# Patient Record
Sex: Female | Born: 1935 | Race: White | Hispanic: No | Marital: Married | Smoking: Never smoker
Health system: Southern US, Community
[De-identification: ages and names within clinical notes are randomized; demographics above are authoritative.]

## PROBLEM LIST (undated history)

## (undated) DIAGNOSIS — I10 Essential (primary) hypertension: Secondary | ICD-10-CM

## (undated) DIAGNOSIS — E785 Hyperlipidemia, unspecified: Secondary | ICD-10-CM

## (undated) DIAGNOSIS — H409 Unspecified glaucoma: Secondary | ICD-10-CM

## (undated) HISTORY — PX: ABDOMINAL HYSTERECTOMY: SHX81

## (undated) HISTORY — DX: Essential (primary) hypertension: I10

## (undated) HISTORY — DX: Unspecified glaucoma: H40.9

## (undated) HISTORY — PX: EYE SURGERY: SHX253

## (undated) HISTORY — DX: Hyperlipidemia, unspecified: E78.5

---
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
## 2004-02-18 ENCOUNTER — Ambulatory Visit: Payer: Self-pay | Admitting: Internal Medicine

## 2005-01-13 ENCOUNTER — Ambulatory Visit: Payer: Self-pay | Admitting: Internal Medicine

## 2005-03-17 ENCOUNTER — Ambulatory Visit: Payer: Self-pay | Admitting: Internal Medicine

## 2006-04-08 ENCOUNTER — Ambulatory Visit: Payer: Self-pay | Admitting: Internal Medicine

## 2006-05-27 ENCOUNTER — Ambulatory Visit (HOSPITAL_COMMUNITY): Admission: RE | Admit: 2006-05-27 | Discharge: 2006-05-27 | Payer: Self-pay | Admitting: Ophthalmology

## 2007-04-12 ENCOUNTER — Ambulatory Visit: Payer: Self-pay | Admitting: Internal Medicine

## 2008-05-30 ENCOUNTER — Ambulatory Visit: Payer: Self-pay | Admitting: Internal Medicine

## 2009-06-03 ENCOUNTER — Ambulatory Visit: Payer: Self-pay | Admitting: Internal Medicine

## 2010-02-11 ENCOUNTER — Ambulatory Visit: Payer: Self-pay | Admitting: Urology

## 2010-04-09 ENCOUNTER — Ambulatory Visit: Payer: Self-pay | Admitting: Internal Medicine

## 2010-09-01 ENCOUNTER — Ambulatory Visit: Payer: Self-pay | Admitting: Internal Medicine

## 2011-10-05 DIAGNOSIS — E782 Mixed hyperlipidemia: Secondary | ICD-10-CM | POA: Insufficient documentation

## 2011-10-05 DIAGNOSIS — H409 Unspecified glaucoma: Secondary | ICD-10-CM | POA: Insufficient documentation

## 2011-10-05 DIAGNOSIS — E785 Hyperlipidemia, unspecified: Secondary | ICD-10-CM | POA: Insufficient documentation

## 2011-10-05 DIAGNOSIS — I10 Essential (primary) hypertension: Secondary | ICD-10-CM | POA: Insufficient documentation

## 2011-10-06 DIAGNOSIS — Z961 Presence of intraocular lens: Secondary | ICD-10-CM | POA: Insufficient documentation

## 2012-02-02 ENCOUNTER — Ambulatory Visit: Payer: Self-pay | Admitting: Internal Medicine

## 2013-01-10 DIAGNOSIS — H401490 Capsular glaucoma with pseudoexfoliation of lens, unspecified eye, stage unspecified: Secondary | ICD-10-CM | POA: Insufficient documentation

## 2013-03-20 ENCOUNTER — Ambulatory Visit: Payer: Self-pay | Admitting: Internal Medicine

## 2014-06-14 ENCOUNTER — Ambulatory Visit
Admission: RE | Admit: 2014-06-14 | Discharge: 2014-06-14 | Disposition: A | Discharge status: Home / Self Care | Payer: Medicare Other | Source: Ambulatory Visit | Attending: Gastroenterology | Admitting: Gastroenterology

## 2014-06-14 ENCOUNTER — Encounter
Admission: RE | Disposition: A | Discharge status: Home / Self Care | Payer: Self-pay | Source: Ambulatory Visit | Attending: Gastroenterology

## 2014-06-14 ENCOUNTER — Ambulatory Visit: Payer: Medicare Other | Admitting: Anesthesiology

## 2014-06-14 ENCOUNTER — Encounter: Payer: Self-pay | Admitting: Gastroenterology

## 2014-06-14 DIAGNOSIS — I10 Essential (primary) hypertension: Secondary | ICD-10-CM | POA: Insufficient documentation

## 2014-06-14 DIAGNOSIS — E785 Hyperlipidemia, unspecified: Secondary | ICD-10-CM | POA: Diagnosis not present

## 2014-06-14 DIAGNOSIS — R197 Diarrhea, unspecified: Secondary | ICD-10-CM | POA: Diagnosis not present

## 2014-06-14 DIAGNOSIS — Z79899 Other long term (current) drug therapy: Secondary | ICD-10-CM | POA: Diagnosis not present

## 2014-06-14 DIAGNOSIS — K625 Hemorrhage of anus and rectum: Secondary | ICD-10-CM | POA: Diagnosis present

## 2014-06-14 DIAGNOSIS — K5730 Diverticulosis of large intestine without perforation or abscess without bleeding: Secondary | ICD-10-CM | POA: Diagnosis not present

## 2014-06-14 DIAGNOSIS — R109 Unspecified abdominal pain: Secondary | ICD-10-CM | POA: Diagnosis not present

## 2014-06-14 HISTORY — PX: COLONOSCOPY: SHX5424

## 2014-06-14 SURGERY — COLONOSCOPY
Anesthesia: General

## 2014-06-14 MED ORDER — LIDOCAINE HCL (CARDIAC) 20 MG/ML IV SOLN
INTRAVENOUS | Status: DC | PRN
  Administered 2014-06-14: 40 mg via INTRAVENOUS

## 2014-06-14 MED ORDER — PROPOFOL 10 MG/ML IV BOLUS
INTRAVENOUS | Status: DC | PRN
  Administered 2014-06-14: 10 mg via INTRAVENOUS
  Administered 2014-06-14: 30 mg via INTRAVENOUS
  Administered 2014-06-14: 10 mg via INTRAVENOUS

## 2014-06-14 MED ORDER — SODIUM CHLORIDE 0.9 % IV SOLN
INTRAVENOUS | Status: DC
  Administered 2014-06-14: 09:00:00 via INTRAVENOUS

## 2014-06-14 MED ORDER — PROPOFOL INFUSION 10 MG/ML OPTIME
INTRAVENOUS | Status: DC | PRN
  Administered 2014-06-14: 120 ug/kg/min via INTRAVENOUS

## 2014-06-14 NOTE — Op Note (Signed)
[facility] Gastroenterology Patient Name: Kiwanna Mackowiak Procedure Date: 06/14/2014 8:59 AM MRN: [id number] Account #: [id number] Date of Birth: Apr 13, 1930 Admit Type: Outpatient Age: 81 [facility] 4 Gender: Female Note Status: Finalized Procedure:         Colonoscopy Indications:       Rectal bleeding, Bleeding has stopped Providers:         Ezzard StandingPaul Y. Bluford Kaufmannh, MD Referring MD:      Barbette ReichmannVishwanath Hande, MD (Referring MD) Medicines:         Monitored Anesthesia Care Complications:     No immediate complications. Procedure:         Pre-Anesthesia Assessment:                    - Prior to the procedure, a History and Physical was                     performed, and patient medications, allergies and                     sensitivities were reviewed. The patient's tolerance of                     previous anesthesia was reviewed.                    - The risks and benefits of the procedure and the sedation                     options and risks were discussed with the patient. All                     questions were answered and informed consent was obtained.                    - After reviewing the risks and benefits, the patient was                     deemed in satisfactory condition to undergo the procedure.                    After obtaining informed consent, the colonoscope was                     passed under direct vision. Throughout the procedure, the                     patient's blood pressure, pulse, and oxygen saturations                     were monitored continuously. The Olympus PCF-160AL                     Colonoscope (S# [id number]) was introduced through the anus                     and advanced to the the cecum, identified by appendiceal                     orifice and ileocecal valve. The colonoscopy was performed                     without difficulty. The patient tolerated the procedure  well. The quality of the bowel preparation was  good. Findings:      Multiple small and large-mouthed diverticula were found in the sigmoid       colon.      The exam was otherwise without abnormality. Impression:        - Diverticulosis in the sigmoid colon.                    - The examination was otherwise normal.                    - No specimens collected.                    - Pt likely had diverticular bleed. Recommendation:    - Discharge patient to home.                    - The findings and recommendations were discussed with the                     patient. Procedure Code(s): --- Professional ---                    [id number], Colonoscopy, flexible; diagnostic, including                     collection of specimen(s) by brushing or washing, when                     performed (separate procedure) Diagnosis Code(s): --- Professional ---                    K62.5, Hemorrhage of anus and rectum                    K57.30, Diverticulosis of large intestine without                     perforation or abscess without bleeding CPT copyright 2014 American Medical Association. All rights reserved. The codes documented in this report are preliminary and upon coder review may  be revised to meet current compliance requirements. Wallace CullensPaul Y Lillian Tigges, MD 06/14/2014 9:30:16 AM This report has been signed electronically. Number of Addenda: 0 Note Initiated On: 06/14/2014 8:59 AM Scope Withdrawal Time: 0 hours 3 minutes 15 seconds  Total Procedure Duration: 0 hours 7 minutes 24 seconds       [facility]

## 2014-06-14 NOTE — H&P (Signed)
  Date of Initial H&P:05/29/2014  History reviewed, patient examined, no change in status, stable for surgery. 

## 2014-06-14 NOTE — Anesthesia Preprocedure Evaluation (Signed)
Anesthesia Evaluation  Patient identified by MRN, date of birth, ID band Patient awake    Reviewed: Allergy & Precautions, NPO status , Patient's Chart, lab work & pertinent test results  History of Anesthesia Complications Negative for: history of anesthetic complications  Airway Mallampati: II       Dental no notable dental hx. (+) Chipped   Pulmonary neg pulmonary ROS,    Pulmonary exam normal       Cardiovascular Exercise Tolerance: Good hypertension, Pt. on medications Normal cardiovascular examRhythm:Regular Rate:Normal     Neuro/Psych negative neurological ROS  negative psych ROS   GI/Hepatic negative GI ROS, Neg liver ROS,   Endo/Other  negative endocrine ROS  Renal/GU   negative genitourinary   Musculoskeletal negative musculoskeletal ROS (+)   Abdominal   Peds negative pediatric ROS (+)  Hematology negative hematology ROS (+)   Anesthesia Other Findings   Reproductive/Obstetrics negative OB ROS                             Anesthesia Physical Anesthesia Plan  ASA: II  Anesthesia Plan: General   Post-op Pain Management:    Induction: Intravenous  Airway Management Planned: Nasal Cannula  Additional Equipment:   Intra-op Plan:   Post-operative Plan:   Informed Consent: I have reviewed the patients History and Physical, chart, labs and discussed the procedure including the risks, benefits and alternatives for the proposed anesthesia with the patient or authorized representative who has indicated his/her understanding and acceptance.     Plan Discussed with: CRNA and Surgeon  Anesthesia Plan Comments:         Anesthesia Quick Evaluation

## 2014-06-14 NOTE — Anesthesia Postprocedure Evaluation (Signed)
  Anesthesia Post-op Note  Patient: Stacey Lee  Procedure(s) Performed: Procedure(s): COLONOSCOPY (N/A)  Anesthesia type:General  Patient location: PACU  Post pain: Pain level controlled  Post assessment: Post-op Vital signs reviewed, Patient's Cardiovascular Status Stable, Respiratory Function Stable, Patent Airway and No signs of Nausea or vomiting  Post vital signs: Reviewed and stable  Last Vitals:  Filed Vitals:   06/14/14 0933  BP:   Pulse:   Temp: 35.9 C  Resp:     Level of consciousness: awake, alert  and patient cooperative  Complications: No apparent anesthesia complications

## 2014-06-14 NOTE — Transfer of Care (Signed)
Immediate Anesthesia Transfer of Care Note  Patient: Stacey Lee  Procedure(s) Performed: Procedure(s): COLONOSCOPY (N/A)  Patient Location: Endoscopy Unit  Anesthesia Type:General  Level of Consciousness: sedated  Airway & Oxygen Therapy: Patient Spontanous Breathing and Patient connected to nasal cannula oxygen  Post-op Assessment: Report given to RN and Post -op Vital signs reviewed and stable  Post vital signs: Reviewed and stable  Last Vitals:  Filed Vitals:   06/14/14 0932  BP: 112/45  Pulse: 73  Temp: 35.8 C  Resp: 20    Complications: No apparent anesthesia complications

## 2014-06-14 NOTE — Anesthesia Procedure Notes (Signed)
Performed by: Hafsah Hendler Pre-anesthesia Checklist: Patient identified, Suction available, Emergency Drugs available, Timeout performed and Patient being monitored Patient Re-evaluated:Patient Re-evaluated prior to inductionOxygen Delivery Method: Nasal cannula Intubation Type: IV induction       

## 2014-06-14 NOTE — Anesthesia Postprocedure Evaluation (Signed)
  Anesthesia Post-op Note  Patient: Stacey Lee  Procedure(s) Performed: Procedure(s): COLONOSCOPY (N/A)  Anesthesia type:General  Patient location: PACU  Post pain: Pain level controlled  Post assessment: Post-op Vital signs reviewed, Patient's Cardiovascular Status Stable, Respiratory Function Stable, Patent Airway and No signs of Nausea or vomiting  Post vital signs: Reviewed and stable  Last Vitals:  Filed Vitals:   06/14/14 0831  BP: 171/77  Pulse: 77  Temp: 36.8 C  Resp: 17    Level of consciousness: awake, alert  and patient cooperative  Complications: No apparent anesthesia complications

## 2014-06-19 ENCOUNTER — Encounter: Payer: Self-pay | Admitting: Gastroenterology

## 2014-06-26 ENCOUNTER — Other Ambulatory Visit: Payer: Self-pay | Admitting: Internal Medicine

## 2014-06-26 DIAGNOSIS — Z1231 Encounter for screening mammogram for malignant neoplasm of breast: Secondary | ICD-10-CM

## 2014-07-04 ENCOUNTER — Ambulatory Visit
Admission: RE | Admit: 2014-07-04 | Discharge: 2014-07-04 | Disposition: A | Discharge status: Home / Self Care | Payer: Medicare Other | Source: Ambulatory Visit | Attending: Internal Medicine | Admitting: Internal Medicine

## 2014-07-04 ENCOUNTER — Other Ambulatory Visit: Payer: Self-pay | Admitting: Internal Medicine

## 2014-07-04 DIAGNOSIS — Z1231 Encounter for screening mammogram for malignant neoplasm of breast: Secondary | ICD-10-CM | POA: Diagnosis present

## 2015-05-27 ENCOUNTER — Other Ambulatory Visit: Payer: Self-pay | Admitting: Internal Medicine

## 2015-05-27 DIAGNOSIS — Z1231 Encounter for screening mammogram for malignant neoplasm of breast: Secondary | ICD-10-CM

## 2015-07-05 ENCOUNTER — Ambulatory Visit: Payer: Medicare Other

## 2015-07-18 ENCOUNTER — Ambulatory Visit: Payer: Medicare Other | Attending: Internal Medicine

## 2015-08-20 ENCOUNTER — Other Ambulatory Visit: Payer: Self-pay | Admitting: Internal Medicine

## 2015-08-20 ENCOUNTER — Ambulatory Visit
Admission: RE | Admit: 2015-08-20 | Discharge: 2015-08-20 | Disposition: A | Discharge status: Home / Self Care | Payer: Medicare Other | Source: Ambulatory Visit | Attending: Internal Medicine | Admitting: Internal Medicine

## 2015-08-20 DIAGNOSIS — Z1231 Encounter for screening mammogram for malignant neoplasm of breast: Secondary | ICD-10-CM

## 2016-07-20 ENCOUNTER — Other Ambulatory Visit: Payer: Self-pay | Admitting: Internal Medicine

## 2016-07-20 DIAGNOSIS — Z1231 Encounter for screening mammogram for malignant neoplasm of breast: Secondary | ICD-10-CM

## 2016-08-21 ENCOUNTER — Ambulatory Visit
Admission: RE | Admit: 2016-08-21 | Discharge: 2016-08-21 | Disposition: A | Discharge status: Home / Self Care | Payer: Medicare Other | Source: Ambulatory Visit | Attending: Internal Medicine | Admitting: Internal Medicine

## 2016-08-21 DIAGNOSIS — Z1231 Encounter for screening mammogram for malignant neoplasm of breast: Secondary | ICD-10-CM | POA: Insufficient documentation

## 2017-07-19 ENCOUNTER — Other Ambulatory Visit: Payer: Self-pay | Admitting: Internal Medicine

## 2017-07-19 DIAGNOSIS — Z1231 Encounter for screening mammogram for malignant neoplasm of breast: Secondary | ICD-10-CM

## 2017-08-06 ENCOUNTER — Other Ambulatory Visit: Payer: Self-pay | Admitting: Internal Medicine

## 2017-08-06 DIAGNOSIS — R2681 Unsteadiness on feet: Secondary | ICD-10-CM

## 2017-08-06 DIAGNOSIS — R42 Dizziness and giddiness: Secondary | ICD-10-CM

## 2017-08-06 DIAGNOSIS — I10 Essential (primary) hypertension: Secondary | ICD-10-CM

## 2017-08-06 DIAGNOSIS — E7800 Pure hypercholesterolemia, unspecified: Secondary | ICD-10-CM

## 2017-08-21 ENCOUNTER — Ambulatory Visit
Admission: RE | Admit: 2017-08-21 | Discharge: 2017-08-21 | Disposition: A | Discharge status: Home / Self Care | Payer: Medicare HMO | Source: Ambulatory Visit | Attending: Internal Medicine | Admitting: Internal Medicine

## 2017-08-21 DIAGNOSIS — I10 Essential (primary) hypertension: Secondary | ICD-10-CM

## 2017-08-21 DIAGNOSIS — R2681 Unsteadiness on feet: Secondary | ICD-10-CM | POA: Insufficient documentation

## 2017-08-21 DIAGNOSIS — R42 Dizziness and giddiness: Secondary | ICD-10-CM | POA: Insufficient documentation

## 2017-08-21 DIAGNOSIS — E7800 Pure hypercholesterolemia, unspecified: Secondary | ICD-10-CM | POA: Insufficient documentation

## 2017-08-21 DIAGNOSIS — I739 Peripheral vascular disease, unspecified: Secondary | ICD-10-CM | POA: Insufficient documentation

## 2017-08-23 ENCOUNTER — Ambulatory Visit
Admission: RE | Admit: 2017-08-23 | Discharge: 2017-08-23 | Disposition: A | Discharge status: Home / Self Care | Payer: Medicare HMO | Source: Ambulatory Visit | Attending: Internal Medicine | Admitting: Internal Medicine

## 2017-08-23 DIAGNOSIS — Z1231 Encounter for screening mammogram for malignant neoplasm of breast: Secondary | ICD-10-CM | POA: Diagnosis not present

## 2017-08-31 NOTE — Progress Notes (Signed)
09/01/2017 8:43 AM   Nakaya M Kamau 04/30/1930 [id number]  Referring provider: Barbette Reichmann, MD [address] [facility] [place], [place] [address]  Chief Complaint  Patient presents with  . Hematuria    HPI: Patient is a 84 -year-old Caucasian female who presents today as a referral from Dr. Marcello Fennel for microscopic hematuria.    Patient was found to have microscopic hematuria on 07/28/2017 with 4-10 WBC's and 10-50 RBC's/hpf.  Urine culture is negative.  Patient does have a prior history of microscopic hematuria.  She underwent a work up in 2003 with IVP and cystoscopy.  No significant findings.  She underwent a work up in 2012 with CTU and cystoscopy.  No significant findings.    She does have a prior history of recurrent urinary tract infections.  She does not have a history of nephrolithiasis, trauma to the genitourinary tract or malignancies of the genitourinary tract.   She does not have a family medical history of nephrolithiasis, malignancies of the genitourinary tract or hematuria.   Today, she is having symptoms of strong urgency, nocturia x 1 and stress incontinence for the last two years.  Patient denies any gross hematuria, dysuria or suprapubic/flank pain.  Patient denies any fevers, chills, nausea or vomiting. Her UA today demonstrates 11-30 RBC's.    She is not a smoker.  She is not exposed to secondhand smoke.  She has not worked with Personnel officer, trichloroethylene, etc.   She has HTN.    PMH: Past Medical History:  Diagnosis Date  . Glaucoma   . Hyperlipidemia   . Hypertension     Surgical History: Past Surgical History:  Procedure Laterality Date  . ABDOMINAL HYSTERECTOMY    . BREAST BIOPSY Right 1954   negative  . COLONOSCOPY N/A 06/14/2014   Procedure: COLONOSCOPY;  Surgeon: Wallace Cullens, MD;  Location: [facility] ENDOSCOPY;  Service: Gastroenterology;  Laterality: N/A;  . EYE SURGERY      Home Medications:  Allergies as of  09/01/2017      Reactions   Acetanilide Shortness Of Breath, Nausea Only   Acetazolamide Nausea Only, Other (See Comments)   Other Reaction: SOB, dizzy (po) Other Reaction: SOB, dizzy (po)   Penicillins Swelling      Medication List        Accurate as of 09/01/17  8:43 AM. Always use your most recent med list.          amLODipine 2.5 MG tablet Commonly known as:  NORVASC TAKE 1 TABLET BY MOUTH ONCE DAILY   CALCIUM 600 + D PO Take 1 tablet by mouth 2 (two) times daily.   Co Q 10 100 MG Caps Take 1 tablet by mouth daily.   Fish Oil 1000 MG Caps Take 1 capsule by mouth 2 (two) times daily.   ibuprofen 200 MG tablet Commonly known as:  ADVIL,MOTRIN Take 400 mg by mouth every 6 (six) hours as needed for headache or moderate pain.   lisinopril 30 MG tablet Commonly known as:  PRINIVIL,ZESTRIL Take 30 mg by mouth daily.   lovastatin 20 MG tablet Commonly known as:  MEVACOR Take 20 mg by mouth 3 (three) times a week. Monday, Wednesday, and Friday   multivitamin with minerals Tabs tablet Take 1 tablet by mouth daily.   prednisoLONE acetate 1 % ophthalmic suspension Commonly known as:  PRED FORTE Place 1 drop into the left eye daily.   Red Yeast Rice 600 MG Caps Take 1 capsule by  mouth 2 (two) times daily.   RESTASIS 0.05 % ophthalmic emulsion Generic drug:  cycloSPORINE INSTILL 1 DROP INTO EACH EYE TWICE DAILY       Allergies:  Allergies  Allergen Reactions  . Acetanilide Shortness Of Breath and Nausea Only  . Acetazolamide Nausea Only and Other (See Comments)    Other Reaction: SOB, dizzy (po) Other Reaction: SOB, dizzy (po)   . Penicillins Swelling    Family History: Family History  Problem Relation Age of Onset  . Prostate cancer Father   . Tuberculosis Maternal Grandfather   . Breast cancer Neg Hx   . Kidney cancer Neg Hx   . Bladder Cancer Neg Hx     Social History:  reports that she has never smoked. She has never used smokeless tobacco. She  reports that she drank alcohol. She reports that she does not use drugs.  ROS: UROLOGY Frequent Urination?: No Hard to postpone urination?: Yes Burning/pain with urination?: No Get up at night to urinate?: Yes Leakage of urine?: Yes Urine stream starts and stops?: No Trouble starting stream?: No Do you have to strain to urinate?: No Blood in urine?: No Urinary tract infection?: No Sexually transmitted disease?: No Injury to kidneys or bladder?: No Painful intercourse?: No Weak stream?: No Currently pregnant?: No Vaginal bleeding?: No Last menstrual period?: n  Gastrointestinal Nausea?: No Vomiting?: No Indigestion/heartburn?: Yes Diarrhea?: No Constipation?: No  Constitutional Fever: No Night sweats?: No Weight loss?: No Fatigue?: No  Skin Skin rash/lesions?: No Itching?: No  Eyes Blurred vision?: No Double vision?: No  Ears/Nose/Throat Sore throat?: No Sinus problems?: Yes  Hematologic/Lymphatic Swollen glands?: No Easy bruising?: No  Cardiovascular Leg swelling?: No Chest pain?: No  Respiratory Cough?: No Shortness of breath?: No  Endocrine Excessive thirst?: No  Musculoskeletal Back pain?: Yes Joint pain?: No  Neurological Headaches?: No Dizziness?: Yes  Psychologic Depression?: No Anxiety?: No  Physical Exam: BP (!) 178/82 (BP Location: Left Arm, Patient Position: Sitting, Cuff Size: Normal)   Pulse 79   Ht 5' (1.524 m)   Wt 123 lb 9.6 oz (56.1 kg)   BMI 24.14 kg/m   Constitutional:  Well nourished. Alert and oriented, No acute distress. HEENT: [place] AT, moist mucus membranes.  Trachea midline, no masses. Cardiovascular: No clubbing, cyanosis, or edema. Respiratory: Normal respiratory effort, no increased work of breathing. GI: Abdomen is soft, non tender, non distended, no abdominal masses. Liver and spleen not palpable.  No hernias appreciated.  Stool sample for occult testing is not indicated.   GU: No CVA tenderness.  No  bladder fullness or masses.   Skin: No rashes, bruises or suspicious lesions. Lymph: No cervical or inguinal adenopathy. Neurologic: Grossly intact, no focal deficits, moving all 4 extremities. Psychiatric: Normal mood and affect.  Laboratory Data: No results found for: WBC, HGB, HCT, MCV, PLT  No results found for: CREATININE  No results found for: PSA  No results found for: TESTOSTERONE  No results found for: HGBA1C  No results found for: TSH  No results found for: CHOL, HDL, CHOLHDL, VLDL, LDLCALC  No results found for: AST No results found for: ALT No components found for: ALKALINEPHOPHATASE No components found for: BILIRUBINTOTAL  No results found for: ESTRADIOL   Urinalysis 11-30 RBC's.  See HPI and Epic.   I have reviewed the labs.  Assessment & Plan:    1. Microscopic hematuria Explained to the patient that there are a number of causes that can be associated with blood in the  urine, such as stones, UTI's, damage to the urinary tract and/or cancer. At this time, I felt that the patient warranted further urologic evaluation.   The AUA guidelines state that a CT urogram is the preferred imaging study to evaluate hematuria. I explained to the patient that a contrast material will be injected into a vein and that in rare instances, an allergic reaction can result and may even life threatening   The patient denies any allergies to contrast, iodine and/or seafood and is not taking metformin. Following the imaging study,  I've recommended a cystoscopy. I described how this is performed, typically in an office setting with a flexible cystoscope. We described the risks, benefits, and possible side effects, the most common of which is a minor amount of blood in the urine and/or burning which usually resolves in 24 to 48 hours.   The patient had the opportunity to ask questions which were answered. Based upon this discussion, the patient is willing to proceed. Therefore, I've  ordered: a CT Urogram and cystoscopy.  The patient will return following all of the above for discussion of the results.  UA 11-30 RBC's  Urine culture pending BUN + creatinine pending   Return for CT Urogram report and cystoscopy; patient is a former patient of Dr. Stoioff .  These notes generated with voice recognition software. I apologize for typographical errors.  Michiel CowbLonna CobboySHANNON Tamirah George, PA-C  [facility] [address]  [place], [place] [address] [phone]

## 2017-09-01 ENCOUNTER — Ambulatory Visit: Payer: Medicare HMO | Admitting: Urology

## 2017-09-01 ENCOUNTER — Encounter: Payer: Self-pay | Admitting: Urology

## 2017-09-01 VITALS — BP 178/82 | HR 79 | Ht 60.0 in | Wt 123.6 lb

## 2017-09-01 DIAGNOSIS — R3129 Other microscopic hematuria: Secondary | ICD-10-CM

## 2017-09-01 LAB — URINALYSIS, COMPLETE
BILIRUBIN UA: NEGATIVE
Glucose, UA: NEGATIVE
Ketones, UA: NEGATIVE
Nitrite, UA: NEGATIVE
PH UA: 6 (ref 5.0–7.5)
PROTEIN UA: NEGATIVE
Specific Gravity, UA: 1.02 (ref 1.005–1.030)
Urobilinogen, Ur: 0.2 mg/dL (ref 0.2–1.0)

## 2017-09-01 LAB — MICROSCOPIC EXAMINATION

## 2017-09-01 NOTE — Patient Instructions (Signed)
Hematuria, Adult Hematuria is blood in your urine. It can be caused by a bladder infection, kidney infection, prostate infection, kidney stone, or cancer of your urinary tract. Infections can usually be treated with medicine, and a kidney stone usually will pass through your urine. If neither of these is the cause of your hematuria, further workup to find out the reason may be needed. It is very important that you tell your health care provider about any blood you see in your urine, even if the blood stops without treatment or happens without causing pain. Blood in your urine that happens and then stops and then happens again can be a symptom of a very serious condition. Also, pain is not a symptom in the initial stages of many urinary cancers. Follow these instructions at home:  Drink lots of fluid, 3-4 quarts a day. If you have been diagnosed with an infection, cranberry juice is especially recommended, in addition to large amounts of water.  Avoid caffeine, tea, and carbonated beverages because they tend to irritate the bladder.  Avoid alcohol because it may irritate the prostate.  Take all medicines as directed by your health care provider.  If you were prescribed an antibiotic medicine, finish it all even if you start to feel better.  If you have been diagnosed with a kidney stone, follow your health care provider's instructions regarding straining your urine to catch the stone.  Empty your bladder often. Avoid holding urine for long periods of time.  After a bowel movement, women should cleanse front to back. Use each tissue only once.  Empty your bladder before and after sexual intercourse if you are a female. Contact a health care provider if:  You develop back pain.  You have a fever.  You have a feeling of sickness in your stomach (nausea) or vomiting.  Your symptoms are not better in 3 days. Return sooner if you are getting worse. Get help right away if:  You develop  severe vomiting and are unable to keep the medicine down.  You develop severe back or abdominal pain despite taking your medicines.  You begin passing a large amount of blood or clots in your urine.  You feel extremely weak or faint, or you pass out. This information is not intended to replace advice given to you by your health care provider. Make sure you discuss any questions you have with your health care provider. Document Released: 01/12/2005 Document Revised: 06/20/2015 Document Reviewed: 09/12/2012 Elsevier Interactive Patient Education  2017 Elsevier Inc.  CT Scan A CT scan (computed tomography scan) is an imaging scan. It uses X-rays and a computer to make detailed pictures of different areas inside the body. A CT scan can give more information than a regular X-ray exam. A CT scan provides data about internal organs, soft tissue structures, blood vessels, and bones. In this procedure, the pictures will be taken in a large machine that has an opening (CT scanner). Tell a health care provider about:  Any allergies you have.  All medicines you are taking, including vitamins, herbs, eye drops, creams, and over-the-counter medicines.  Any blood disorders you have.  Any surgeries you have had.  Any medical conditions you have.  Whether you are pregnant or may be pregnant. What are the risks? Generally, this is a safe procedure. However, problems may occur, including:  An allergic reaction to dyes.  Development of cancer from excessive exposure to radiation from multiple CT scans. This is rare.  What happens before   the procedure? Staying hydrated Follow instructions from your health care provider about hydration, which may include:  Up to 2 hours before the procedure - you may continue to drink clear liquids, such as water, clear fruit juice, black coffee, and plain tea.  Eating and drinking restrictions Follow instructions from your health care provider about eating and  drinking, which may include:  24 hours before the procedure - stop drinking caffeinated beverages, such as energy drinks, tea, soda, coffee, and hot chocolate.  8 hours before the procedure - stop eating heavy meals or foods such as meat, fried foods, or fatty foods.  6 hours before the procedure - stop eating light meals or foods, such as toast or cereal.  6 hours before the procedure - stop drinking milk or drinks that contain milk.  2 hours before the procedure - stop drinking clear liquids.  General instructions  Remove any jewelry.  Ask your health care provider about changing or stopping your regular medicines. This is especially important if you are taking diabetes medicines or blood thinners. What happens during the procedure?  You will lie on a table with your arms above your head.  An IV tube may be inserted into one of your veins.  The contrast dye may be injected into the IV tube. You may feel warm or have a metallic taste in your mouth.  The table you will be lying on will move into the CT scanner.  You will be able to see, hear, and talk to the person running the machine while you are in it. Follow that person's instructions.  The CT scanner will move around you to take pictures. Do not move while it is scanning. Staying still helps the scanner to get a good image.  When the best possible pictures have been taken, the machine will be turned off. The table will be moved out of the machine.  The IV tube will be removed. The procedure may vary among health care providers and hospitals. What happens after the procedure?  It is up to you to get the results of your procedure. Ask your health care provider, or the department that is doing the procedure, when your results will be ready. Summary  A CT scan is an imaging scan.  A CT scan uses X-rays and a computer to make detailed pictures of different areas of your body.  Follow instructions from your health care  provider about eating and drinking before the procedure.  You will be able to see, hear, and talk to the person running the machine while you are in it. Follow that person's instructions. This information is not intended to replace advice given to you by your health care provider. Make sure you discuss any questions you have with your health care provider. Document Released: 02/20/2004 Document Revised: 02/15/2016 Document Reviewed: 02/15/2016 Elsevier Interactive Patient Education  2018 Elsevier Inc.  Cystoscopy Cystoscopy is a procedure that is used to help diagnose and sometimes treat conditions that affect that lower urinary tract. The lower urinary tract includes the bladder and the tube that drains urine from the bladder out of the body (urethra). Cystoscopy is performed with a thin, tube-shaped instrument with a light and camera at the end (cystoscope). The cystoscope may be hard (rigid) or flexible, depending on the goal of the procedure.The cystoscope is inserted through the urethra, into the bladder. Cystoscopy may be recommended if you have:  Urinary tractinfections that keep coming back (recurring).  Blood in the urine (hematuria).    Loss of bladder control (urinary incontinence) or an overactive bladder.  Unusual cells found in a urine sample.  A blockage in the urethra.  Painful urination.  An abnormality in the bladder found during an intravenous pyelogram (IVP) or CT scan.  Cystoscopy may also be done to remove a sample of tissue to be examined under a microscope (biopsy). Tell a health care provider about:  Any allergies you have.  All medicines you are taking, including vitamins, herbs, eye drops, creams, and over-the-counter medicines.  Any problems you or family members have had with anesthetic medicines.  Any blood disorders you have.  Any surgeries you have had.  Any medical conditions you have.  Whether you are pregnant or may be pregnant. What are the  risks? Generally, this is a safe procedure. However, problems may occur, including:  Infection.  Bleeding.  Allergic reactions to medicines.  Damage to other structures or organs.  What happens before the procedure?  Ask your health care provider about: ? Changing or stopping your regular medicines. This is especially important if you are taking diabetes medicines or blood thinners. ? Taking medicines such as aspirin and ibuprofen. These medicines can thin your blood. Do not take these medicines before your procedure if your health care provider instructs you not to.  Follow instructions from your health care provider about eating or drinking restrictions.  You may be given antibiotic medicine to help prevent infection.  You may have an exam or testing, such as X-rays of the bladder, urethra, or kidneys.  You may have urine tests to check for signs of infection.  Plan to have someone take you home after the procedure. What happens during the procedure?  To reduce your risk of infection,your health care team will wash or sanitize their hands.  You will be given one or more of the following: ? A medicine to help you relax (sedative). ? A medicine to numb the area (local anesthetic).  The area around the opening of your urethra will be cleaned.  The cystoscope will be passed through your urethra into your bladder.  Germ-free (sterile)fluid will flow through the cystoscope to fill your bladder. The fluid will stretch your bladder so that your surgeon can clearly examine your bladder walls.  The cystoscope will be removed and your bladder will be emptied. The procedure may vary among health care providers and hospitals. What happens after the procedure?  You may have some soreness or pain in your abdomen and urethra. Medicines will be available to help you.  You may have some blood in your urine.  Do not drive for 24 hours if you received a sedative. This information is  not intended to replace advice given to you by your health care provider. Make sure you discuss any questions you have with your health care provider. Document Released: 01/10/2000 Document Revised: 05/23/2015 Document Reviewed: 11/29/2014 Elsevier Interactive Patient Education  2018 Elsevier Inc.  

## 2017-09-02 LAB — BUN+CREAT
BUN/Creatinine Ratio: 19 (ref 12–28)
BUN: 15 mg/dL (ref 8–27)
CREATININE: 0.79 mg/dL (ref 0.57–1.00)
GFR, EST AFRICAN AMERICAN: 79 mL/min/{1.73_m2} (ref >59)
GFR, EST NON AFRICAN AMERICAN: 68 mL/min/{1.73_m2} (ref >59)

## 2017-09-04 LAB — CULTURE, URINE COMPREHENSIVE

## 2017-09-17 ENCOUNTER — Ambulatory Visit
Admission: RE | Admit: 2017-09-17 | Discharge: 2017-09-17 | Disposition: A | Discharge status: Home / Self Care | Payer: Medicare HMO | Source: Ambulatory Visit | Attending: Urology | Admitting: Urology

## 2017-09-17 DIAGNOSIS — K449 Diaphragmatic hernia without obstruction or gangrene: Secondary | ICD-10-CM | POA: Diagnosis not present

## 2017-09-17 DIAGNOSIS — I700 Atherosclerosis of aorta: Secondary | ICD-10-CM | POA: Diagnosis not present

## 2017-09-17 DIAGNOSIS — R3129 Other microscopic hematuria: Secondary | ICD-10-CM | POA: Diagnosis present

## 2017-09-17 MED ORDER — IOPAMIDOL (ISOVUE-300) INJECTION 61%
100.0000 mL | Freq: Once | INTRAVENOUS | Status: AC | PRN
  Administered 2017-09-17: 100 mL via INTRAVENOUS

## 2017-10-06 ENCOUNTER — Ambulatory Visit: Payer: Medicare HMO | Admitting: Urology

## 2017-10-06 DIAGNOSIS — R3129 Other microscopic hematuria: Secondary | ICD-10-CM | POA: Diagnosis not present

## 2017-10-06 LAB — URINALYSIS, COMPLETE
Bilirubin, UA: NEGATIVE
Glucose, UA: NEGATIVE
Ketones, UA: NEGATIVE
Nitrite, UA: NEGATIVE
PH UA: 6 (ref 5.0–7.5)
PROTEIN UA: NEGATIVE
Specific Gravity, UA: 1.015 (ref 1.005–1.030)
Urobilinogen, Ur: 0.2 mg/dL (ref 0.2–1.0)

## 2017-10-06 LAB — MICROSCOPIC EXAMINATION

## 2017-10-06 MED ORDER — LIDOCAINE HCL URETHRAL/MUCOSAL 2 % EX GEL
1.0000 "application " | Freq: Once | CUTANEOUS | Status: AC
  Administered 2017-10-06: 1 via URETHRAL

## 2017-10-06 NOTE — Addendum Note (Signed)
Addended by: Frankey Shown on: 10/06/2017 03:05 PM   Modules accepted: Orders

## 2017-10-06 NOTE — Progress Notes (Signed)
   10/06/17  Indications: Microhematuria  HPI: See Carollee Herter McGowan's note dated 09/01/2017.  CTU showed no upper tract abnormalities.  There were no vitals taken for this visit. NED. A&Ox3.   No respiratory distress   Abd soft, NT, ND Atrophic external genitalia with patent urethral meatus.  Small urethral caruncle  Cystoscopy Procedure Note  Patient identification was confirmed, informed consent was obtained, and patient was prepped using Betadine solution.  Lidocaine jelly was administered per urethral meatus.    Preoperative abx where received prior to procedure.    Procedure: - Flexible cystoscope introduced, without any difficulty.   - Thorough search of the bladder revealed:    normal urethral meatus    normal urothelium    no stones    no ulcers     no tumors    no urethral polyps    no trabeculation  - Ureteral orifices were normal in position and appearance.  Post-Procedure: - Patient tolerated the procedure well  Assessment/ Plan: No significant abnormalities on CTU/cystoscopy.  Urinalysis today with significantly decreased microhematuria.  Follow-up 6 months with Carollee Herter for recheck/repeat urinalysis.   Riki Altes, MD

## 2018-04-05 NOTE — Progress Notes (Incomplete)
04/06/2018 8:41 AM   Stacey Lee 11-Dec-1930 [id number]  Referring provider: Barbette Reichmann, MD [address] [facility] [place], [place] [address]  No chief complaint on file.   HPI: Jeanifer KEEONA LIEBELT is a 85 y.o. female Caucasian with microscopic hematuria who presents today for a six month follow up.  Patient was found to have microscopic hematuria on 07/28/2017 with 4-10 WBC's and 10-50 RBC's/hpf.  Urine culture is negative.  Patient does have a prior history of microscopic hematuria.  She underwent a work up in 2003 with IVP and cystoscopy.  No significant findings.  She underwent a work up in 2012 with CTU and cystoscopy.  No significant findings.    She does have a prior history of recurrent urinary tract infections.  She does not have a history of nephrolithiasis, trauma to the genitourinary tract or malignancies of the genitourinary tract.   She does not have a family medical history of nephrolithiasis, malignancies of the genitourinary tract or hematuria.   On 09/01/2017, she had been having symptoms of strong urgency, nocturia x 1 and stress incontinence for the last two years.  Patient denied any gross hematuria, dysuria or suprapubic/flank pain.  Patient denied any fevers, chills, nausea or vomiting.  Her UA demonstrated 11-30 RBC's.  She is not a smoker.  She is not exposed to secondhand smoke.  She has not worked with Personnel officer, trichloroethylene, etc.  She has *** HTN.  Patient had a CTU with Dr. Reche Dixon on 09/17/2017, with the impression of: 1.  No acute process or explanation for hematuria.  2. Air within the urinary bladder is presumably iatrogenic.  Correlate with instrumentation.  3. Small hiatal hernia.  4. Aortic Atherosclerosis (ICD10-I70.0).  Cystoscopy on 10/06/2017 by Dr. Lonna Cobb found no significant abnormalities.  UA on that visit found significantly decreased microhematuria.  ***  PMH: Past Medical History:  Diagnosis Date    Glaucoma    Hyperlipidemia    Hypertension     Surgical History: Past Surgical History:  Procedure Laterality Date   ABDOMINAL HYSTERECTOMY     BREAST BIOPSY Right 1954   negative   COLONOSCOPY N/A 06/14/2014   Procedure: COLONOSCOPY;  Surgeon: Wallace Cullens, MD;  Location: [facility] ENDOSCOPY;  Service: Gastroenterology;  Laterality: N/A;   EYE SURGERY      Home Medications:  Allergies as of 04/06/2018      Reactions   Acetanilide Shortness Of Breath, Nausea Only   Acetazolamide Nausea Only, Other (See Comments)   Other Reaction: SOB, dizzy (po) Other Reaction: SOB, dizzy (po)   Penicillins Swelling      Medication List       Accurate as of April 05, 2018  8:41 AM. Always use your most recent med list.        amLODipine 2.5 MG tablet Commonly known as:  NORVASC TAKE 1 TABLET BY MOUTH ONCE DAILY   CALCIUM 600 + D PO Take 1 tablet by mouth 2 (two) times daily.   Co Q 10 100 MG Caps Take 1 tablet by mouth daily.   Fish Oil 1000 MG Caps Take 1 capsule by mouth 2 (two) times daily.   ibuprofen 200 MG tablet Commonly known as:  ADVIL,MOTRIN Take 400 mg by mouth every 6 (six) hours as needed for headache or moderate pain.   lisinopril 30 MG tablet Commonly known as:  PRINIVIL,ZESTRIL Take 30 mg by mouth daily.   lovastatin 20 MG tablet Commonly known as:  MEVACOR  Take 20 mg by mouth 3 (three) times a week. Monday, Wednesday, and Friday   multivitamin with minerals Tabs tablet Take 1 tablet by mouth daily.   prednisoLONE acetate 1 % ophthalmic suspension Commonly known as:  PRED FORTE Place 1 drop into the left eye daily.   Red Yeast Rice 600 MG Caps Take 1 capsule by mouth 2 (two) times daily.   Restasis 0.05 % ophthalmic emulsion Generic drug:  cycloSPORINE INSTILL 1 DROP INTO EACH EYE TWICE DAILY       Allergies:  Allergies  Allergen Reactions   Acetanilide Shortness Of Breath and Nausea Only   Acetazolamide Nausea Only and Other (See Comments)      Other Reaction: SOB, dizzy (po) Other Reaction: SOB, dizzy (po)    Penicillins Swelling    Family History: Family History  Problem Relation Age of Onset   Prostate cancer Father    Tuberculosis Maternal Grandfather    Breast cancer Neg Hx    Kidney cancer Neg Hx    Bladder Cancer Neg Hx     Social History:  reports that she has never smoked. She has never used smokeless tobacco. She reports previous alcohol use. She reports that she does not use drugs.  ROS:                                        Physical Exam: There were no vitals taken for this visit.  Constitutional: Well nourished. Alert and oriented, No acute distress. {HEENT: [place] AT, moist mucus membranes.  Trachea midline, no masses.} Cardiovascular: No clubbing, cyanosis, or edema. Respiratory: Normal respiratory effort, no increased work of breathing. {GI: Abdomen is soft, non tender, non distended, no abdominal masses. Liver and spleen not palpable.  No hernias appreciated.  Stool sample for occult testing is not indicated.} {GU: No CVA tenderness.  No bladder fullness or masses.  *** external genitalia, *** pubic hair distribution, no lesions.  Normal urethral meatus, no lesions, no prolapse, no discharge.   No urethral masses, tenderness and/or tenderness. No bladder fullness, tenderness or masses. *** vagina mucosa, *** estrogen effect, no discharge, no lesions, *** pelvic support, *** cystocele and *** rectocele noted.  No cervical motion tenderness.  Uterus is freely mobile and non-fixed.  No adnexal/parametria masses or tenderness noted.  Anus and perineum are without rashes or lesions.   *** } Skin: No rashes, bruises or suspicious lesions. {Lymph: No cervical or inguinal adenopathy.} Neurologic: Grossly intact, no focal deficits, moving all 4 extremities. Psychiatric: Normal mood and affect.   Laboratory Data: No results found for: WBC, HGB, HCT, MCV, PLT  Lab Results  Component  Value Date   CREATININE 0.79 09/01/2017    No results found for: PSA  No results found for: TESTOSTERONE  No results found for: HGBA1C  No results found for: TSH  No results found for: CHOL, HDL, CHOLHDL, VLDL, LDLCALC  No results found for: AST No results found for: ALT No components found for: ALKALINEPHOPHATASE No components found for: BILIRUBINTOTAL  No results found for: ESTRADIOL  Urinalysis    Component Value Date/Time   APPEARANCEUR Clear 10/06/2017 0952   GLUCOSEU Negative 10/06/2017 0952   BILIRUBINUR Negative 10/06/2017 0952   PROTEINUR Negative 10/06/2017 0952   NITRITE Negative 10/06/2017 0952   LEUKOCYTESUR Trace (A) 10/06/2017 0952    I have reviewed the labs.  Pertinent Imaging:  Results for orders  placed during the hospital encounter of 09/17/17  CT HEMATURIA WORKUP   Narrative CLINICAL DATA:  Microscopic hematuria. History of recurrent urinary tract infections, urgency, stress incontinence.  EXAM: CT ABDOMEN AND PELVIS WITHOUT AND WITH CONTRAST  TECHNIQUE: Multidetector CT imaging of the abdomen and pelvis was performed following the standard protocol before and following the bolus administration of intravenous contrast.  CONTRAST:  ISOVUE-300 IOPAMIDOL (ISOVUE-300) INJECTION 61% 100 cc of Isovue 300  COMPARISON:  02/11/2010  FINDINGS: Lower chest: Anterior right lung base scarring. Mild cardiomegaly, without pericardial or pleural effusion. Small hiatal hernia.  Hepatobiliary: Normal liver. Normal gallbladder, without biliary ductal dilatation.  Pancreas: Normal, without mass or ductal dilatation.  Spleen: Normal in size, without focal abnormality.  Adrenals/Urinary Tract: Normal adrenal glands. No renal calculi or hydronephrosis. No hydroureter or ureteric calculi. No bladder calculi. Too small to characterize left renal lesions. No suspicious renal mass on post-contrast imaging. Bilateral extrarenal pelves. Good renal  collecting system opacification on delayed images. Good ureteric opacification, without filling defect. Tiny focus of air within the nondependent urinary bladder. No enhancing bladder mass or bladder filling defect identified.  Stomach/Bowel: Normal remainder of the stomach. Extensive colonic diverticulosis. Normal terminal ileum and appendix. Normal small bowel.  Vascular/Lymphatic: Aortic and branch vessel atherosclerosis. No abdominopelvic adenopathy.  Reproductive: Hysterectomy.  No adnexal mass.  Other: No significant free fluid.  Mild pelvic floor laxity.  Musculoskeletal: Left sacral sclerotic lesion is likely a bone island. Mild lumbar spondylosis with prominent disc bulge at L3-4. Hemangioma within the T12 vertebral body.  IMPRESSION: 1.  No acute process or explanation for hematuria. 2. Air within the urinary bladder is presumably iatrogenic. Correlate with instrumentation. 3. Small hiatal hernia. 4.  Aortic Atherosclerosis (ICD10-I70.0).   Electronically Signed   By: Jeronimo Greaves M.D.   On: 09/17/2017 11:47    I have independently reviewed the films.  Assessment & Plan:    1. Microscopic hematuria *** - Explained to the patient that there are a number of causes that can be associated with blood in the urine, such as stones, UTI's, damage to the urinary tract and/or cancer. *** - At this time, I felt that the patient warranted further urologic evaluation.   The AUA guidelines state that a CT urogram is the preferred imaging study to evaluate hematuria. *** - I explained to the patient that a contrast material will be injected into a vein and that in rare instances, an allergic reaction can result and may even life threatening   The patient denies any allergies to contrast, iodine and/or seafood and is not taking metformin. *** - Following the imaging study,  I've recommended a cystoscopy. I described how this is performed, typically in an office setting with a  flexible cystoscope. We described the risks, benefits, and possible side effects, the most common of which is a minor amount of blood in the urine and/or burning which usually resolves in 24 to 48 hours.   *** - The patient had the opportunity to ask questions which were answered. Based upon this discussion, the patient is willing to proceed. Therefore, I've ordered: a CT Urogram and cystoscopy.  *** - The patient will return following all of the above for discussion of the results.  *** - UA 11-30 RBC's  *** - Urine culture pending *** - BUN + creatinine pending  No follow-ups on file.  Michiel Cowboy, PA-C  [facility] [address], [address] [place], [place] [address] [phone]  I, Duanne MoronKatharine Samson, am acting as a Neurosurgeonscribe for Nucor CorporationShannon McGowan, PA-C.   {Add Scribe Attestation Statement}

## 2018-04-06 ENCOUNTER — Ambulatory Visit: Payer: Medicare HMO | Admitting: Urology

## 2018-08-08 ENCOUNTER — Other Ambulatory Visit: Payer: Self-pay | Admitting: Internal Medicine

## 2018-08-08 DIAGNOSIS — Z1231 Encounter for screening mammogram for malignant neoplasm of breast: Secondary | ICD-10-CM

## 2018-09-14 ENCOUNTER — Ambulatory Visit
Admission: RE | Admit: 2018-09-14 | Discharge: 2018-09-14 | Disposition: A | Discharge status: Home / Self Care | Payer: Medicare HMO | Source: Ambulatory Visit | Attending: Internal Medicine | Admitting: Internal Medicine

## 2018-09-14 ENCOUNTER — Other Ambulatory Visit: Payer: Self-pay

## 2018-09-14 DIAGNOSIS — Z1231 Encounter for screening mammogram for malignant neoplasm of breast: Secondary | ICD-10-CM | POA: Diagnosis not present

## 2019-08-09 ENCOUNTER — Other Ambulatory Visit: Payer: Self-pay | Admitting: Internal Medicine

## 2019-08-09 DIAGNOSIS — Z1231 Encounter for screening mammogram for malignant neoplasm of breast: Secondary | ICD-10-CM

## 2019-09-18 ENCOUNTER — Other Ambulatory Visit: Payer: Self-pay

## 2019-09-18 ENCOUNTER — Ambulatory Visit
Admission: RE | Admit: 2019-09-18 | Discharge: 2019-09-18 | Disposition: A | Discharge status: Home / Self Care | Payer: Medicare HMO | Source: Ambulatory Visit | Attending: Internal Medicine | Admitting: Internal Medicine

## 2019-09-18 DIAGNOSIS — Z1231 Encounter for screening mammogram for malignant neoplasm of breast: Secondary | ICD-10-CM | POA: Diagnosis present

## 2019-10-04 ENCOUNTER — Ambulatory Visit: Payer: Medicare HMO | Admitting: Urology

## 2019-10-11 ENCOUNTER — Ambulatory Visit: Payer: Medicare HMO | Admitting: Urology

## 2019-10-11 ENCOUNTER — Encounter: Payer: Self-pay | Admitting: Urology

## 2019-10-11 ENCOUNTER — Other Ambulatory Visit: Payer: Self-pay

## 2019-10-11 VITALS — BP 130/80 | HR 74 | Ht 61.0 in | Wt 116.0 lb

## 2019-10-11 DIAGNOSIS — R3129 Other microscopic hematuria: Secondary | ICD-10-CM | POA: Diagnosis not present

## 2019-10-11 NOTE — Progress Notes (Signed)
10/11/2019 10:32 AM   Stacey Lee 1930-07-28 [id number]  Referring provider: Barbette Reichmann, MD [address] [facility] [place],  [place] [address]  Chief Complaint  Patient presents with  . Hematuria    Urologic history: 1.  Asymptomatic microhematuria -Prior evaluations with upper tract imaging and cystoscopy 2003, 2012 and 2019 -UA 2019 with 10-50 RBCs   HPI: 86 y.o. female re-referred for microhematuria by Dr. Marcello Fennel  Recent UA 4-10 RBCs  Denies gross hematuria  Denies flank, abdominal or pelvic pain  Occasional urge incontinence   PMH: Past Medical History:  Diagnosis Date  . Glaucoma   . Hyperlipidemia   . Hypertension     Surgical History: Past Surgical History:  Procedure Laterality Date  . ABDOMINAL HYSTERECTOMY    . BREAST BIOPSY Right 1954   negative  . COLONOSCOPY N/A 06/14/2014   Procedure: COLONOSCOPY;  Surgeon: Wallace Cullens, MD;  Location: [facility] ENDOSCOPY;  Service: Gastroenterology;  Laterality: N/A;  . EYE SURGERY      Home Medications:  Allergies as of 10/11/2019      Reactions   Acetanilide Shortness Of Breath, Nausea Only   Acetazolamide Nausea Only, Other (See Comments)   Other Reaction: SOB, dizzy (po) Other Reaction: SOB, dizzy (po)   Penicillins Swelling      Medication List       Accurate as of October 11, 2019 10:32 AM. If you have any questions, ask your nurse or doctor.        amLODipine 2.5 MG tablet Commonly known as: NORVASC TAKE 1 TABLET BY MOUTH ONCE DAILY   CALCIUM 600 + D PO Take 1 tablet by mouth 2 (two) times daily.   Co Q 10 100 MG Caps Take 1 tablet by mouth daily.   Fish Oil 1000 MG Caps Take 1 capsule by mouth 2 (two) times daily.   ibuprofen 200 MG tablet Commonly known as: ADVIL Take 400 mg by mouth every 6 (six) hours as needed for headache or moderate pain.   lisinopril 30 MG tablet Commonly known as: ZESTRIL Take 30 mg by mouth daily.   lovastatin 20 MG  tablet Commonly known as: MEVACOR Take 20 mg by mouth 3 (three) times a week. Monday, Wednesday, and Friday   multivitamin with minerals Tabs tablet Take 1 tablet by mouth daily.   prednisoLONE acetate 1 % ophthalmic suspension Commonly known as: PRED FORTE Place 1 drop into the left eye daily.   Red Yeast Rice 600 MG Caps Take 1 capsule by mouth 2 (two) times daily.   Restasis 0.05 % ophthalmic emulsion Generic drug: cycloSPORINE INSTILL 1 DROP INTO EACH EYE TWICE DAILY       Allergies:  Allergies  Allergen Reactions  . Acetanilide Shortness Of Breath and Nausea Only  . Acetazolamide Nausea Only and Other (See Comments)    Other Reaction: SOB, dizzy (po) Other Reaction: SOB, dizzy (po)   . Penicillins Swelling    Family History: Family History  Problem Relation Age of Onset  . Prostate cancer Father   . Tuberculosis Maternal Grandfather   . Breast cancer Neg Hx   . Kidney cancer Neg Hx   . Bladder Cancer Neg Hx     Social History:  reports that she has never smoked. She has never used smokeless tobacco. She reports previous alcohol use. She reports that she does not use drugs.   Physical Exam: BP 130/80   Pulse 74   Ht 5\' 1"  (1.549 m)  Wt 116 lb (52.6 kg)   BMI 21.92 kg/m   Constitutional:  Alert and oriented, No acute distress. HEENT: [place] AT, moist mucus membranes.  Trachea midline, no masses. Cardiovascular: No clubbing, cyanosis, or edema. Respiratory: Normal respiratory effort, no increased work of breathing.    Assessment & Plan:    1. Microscopic hematuria  UA today showed 0-2 RBC  Would not recommend reimaging or cystoscopy unless she developed gross hematuria or significant increase amount of microhematuria (>50 RBCs)    Riki Altes, MD  [facility] [address], [address] [place], [place] [address] [phone]

## 2019-10-12 ENCOUNTER — Encounter: Payer: Self-pay | Admitting: Urology

## 2019-10-12 LAB — URINALYSIS, COMPLETE
Bilirubin, UA: NEGATIVE
Glucose, UA: NEGATIVE
Ketones, UA: NEGATIVE
Nitrite, UA: NEGATIVE
Protein,UA: NEGATIVE
Specific Gravity, UA: 1.015 (ref 1.005–1.030)
Urobilinogen, Ur: 0.2 mg/dL (ref 0.2–1.0)
pH, UA: 7 (ref 5.0–7.5)

## 2019-10-12 LAB — MICROSCOPIC EXAMINATION

## 2020-08-21 ENCOUNTER — Other Ambulatory Visit: Payer: Self-pay | Admitting: Internal Medicine

## 2020-08-21 DIAGNOSIS — Z1231 Encounter for screening mammogram for malignant neoplasm of breast: Secondary | ICD-10-CM

## 2020-09-20 ENCOUNTER — Other Ambulatory Visit: Payer: Self-pay

## 2020-09-20 ENCOUNTER — Ambulatory Visit
Admission: RE | Admit: 2020-09-20 | Discharge: 2020-09-20 | Disposition: A | Discharge status: Home / Self Care | Payer: Medicare HMO | Source: Ambulatory Visit | Attending: Internal Medicine | Admitting: Internal Medicine

## 2020-09-20 DIAGNOSIS — Z1231 Encounter for screening mammogram for malignant neoplasm of breast: Secondary | ICD-10-CM | POA: Diagnosis not present

## 2021-09-08 ENCOUNTER — Other Ambulatory Visit: Payer: Self-pay | Admitting: Internal Medicine

## 2021-09-08 DIAGNOSIS — Z1231 Encounter for screening mammogram for malignant neoplasm of breast: Secondary | ICD-10-CM

## 2021-10-10 ENCOUNTER — Ambulatory Visit
Admission: RE | Admit: 2021-10-10 | Discharge: 2021-10-10 | Disposition: A | Discharge status: Home / Self Care | Payer: Medicare HMO | Source: Ambulatory Visit | Attending: Internal Medicine | Admitting: Internal Medicine

## 2021-10-10 DIAGNOSIS — Z1231 Encounter for screening mammogram for malignant neoplasm of breast: Secondary | ICD-10-CM | POA: Diagnosis present

## 2022-06-01 ENCOUNTER — Other Ambulatory Visit: Payer: Self-pay

## 2022-06-01 ENCOUNTER — Emergency Department
Admission: EM | Admit: 2022-06-01 | Discharge: 2022-06-01 | Disposition: A | Discharge status: Home / Self Care | Payer: Medicare HMO | Attending: Emergency Medicine | Admitting: Emergency Medicine

## 2022-06-01 DIAGNOSIS — R42 Dizziness and giddiness: Secondary | ICD-10-CM | POA: Insufficient documentation

## 2022-06-01 DIAGNOSIS — I10 Essential (primary) hypertension: Secondary | ICD-10-CM | POA: Diagnosis not present

## 2022-06-01 LAB — URINALYSIS, ROUTINE W REFLEX MICROSCOPIC
Bacteria, UA: NONE SEEN
Bilirubin Urine: NEGATIVE
Glucose, UA: NEGATIVE mg/dL
Ketones, ur: NEGATIVE mg/dL
Leukocytes,Ua: NEGATIVE
Nitrite: NEGATIVE
Protein, ur: NEGATIVE mg/dL
Specific Gravity, Urine: 1.006 (ref 1.005–1.030)
Squamous Epithelial / HPF: NONE SEEN /HPF (ref 0–5)
pH: 6 (ref 5.0–8.0)

## 2022-06-01 LAB — BASIC METABOLIC PANEL
Anion gap: 9 (ref 5–15)
BUN: 29 mg/dL — ABNORMAL HIGH (ref 8–23)
CO2: 27 mmol/L (ref 22–32)
Calcium: 9.6 mg/dL (ref 8.9–10.3)
Chloride: 102 mmol/L (ref 98–111)
Creatinine, Ser: 0.66 mg/dL (ref 0.44–1.00)
GFR, Estimated: >60 mL/min (ref >60)
Glucose, Bld: 138 mg/dL — ABNORMAL HIGH (ref 70–99)
Potassium: 4 mmol/L (ref 3.5–5.1)
Sodium: 138 mmol/L (ref 135–145)

## 2022-06-01 LAB — CBC
HCT: 41 % (ref 36.0–46.0)
Hemoglobin: 13.1 g/dL (ref 12.0–15.0)
MCH: 30.3 pg (ref 26.0–34.0)
MCHC: 32 g/dL (ref 30.0–36.0)
MCV: 94.7 fL (ref 80.0–100.0)
Platelets: 225 10*3/uL (ref 150–400)
RBC: 4.33 MIL/uL (ref 3.87–5.11)
RDW: 13.2 % (ref 11.5–15.5)
WBC: 7.5 10*3/uL (ref 4.0–10.5)
nRBC: 0 % (ref 0.0–0.2)

## 2022-06-01 MED ORDER — NITROGLYCERIN 0.4 MG SL SUBL: 0.4000 mg | SUBLINGUAL_TABLET | SUBLINGUAL | 3 refills | Status: AC | PRN

## 2022-06-01 NOTE — ED Provider Notes (Signed)
[facility] Provider Note   Event Date/Time   First MD Initiated Contact with Patient 06/01/22 1503     (approximate) History  Dizziness  HPI Stacey Lee is a 89 y.o. female with stated past medical history of hypertension who presents complaining of lightheadedness and an episode of hypertension occurred during a cardio class today.  Patient states that during her workout she began feeling somewhat lightheaded when she went from sitting to standing and has had lightheadedness when standing for the last few hours.  Patient states that as she has noticed her blood pressure declining, she has noticed her symptoms improving.   ROS: Patient currently denies any vision changes, tinnitus, difficulty speaking, facial droop, sore throat, chest pain, shortness of breath, abdominal pain, nausea/vomiting/diarrhea, dysuria, or weakness/numbness/paresthesias in any extremity   Physical Exam  Triage Vital Signs: ED Triage Vitals  Enc Vitals Group     BP 06/01/22 1316 (!) 175/70     Pulse Rate 06/01/22 1316 73     Resp 06/01/22 1316 16     Temp 06/01/22 1316 98.3 F (36.8 C)     Temp Source 06/01/22 1316 Oral     SpO2 06/01/22 1316 94 %     Weight 06/01/22 1314 115 lb 15.4 oz (52.6 kg)     Height 06/01/22 1314 5\' 1"  (1.549 m)     Head Circumference --      Peak Flow --      Pain Score 06/01/22 1313 0     Pain Loc --      Pain Edu? --      Excl. in GC? --    Most recent vital signs: Vitals:   06/01/22 1316  BP: (!) 175/70  Pulse: 73  Resp: 16  Temp: 98.3 F (36.8 C)  SpO2: 94%   General: Awake, oriented x4. CV:  Good peripheral perfusion.  Resp:  Normal effort.  Abd:  No distention.  Other:  Elderly well-developed, well-nourished Caucasian female laying in bed in no acute distress ED Results / Procedures / Treatments  Labs (all labs ordered are listed, but only abnormal results are displayed) Labs Reviewed  BASIC METABOLIC PANEL - Abnormal; Notable for  the following components:      Result Value   Glucose, Bld 138 (*)    BUN 29 (*)    All other components within normal limits  URINALYSIS, ROUTINE W REFLEX MICROSCOPIC - Abnormal; Notable for the following components:   Color, Urine YELLOW (*)    APPearance CLEAR (*)    Hgb urine dipstick SMALL (*)    All other components within normal limits  CBC  CBG MONITORING, ED   EKG ED ECG REPORT I, Merwyn Katos, the attending physician, personally viewed and interpreted this ECG. Date: 06/01/2022 EKG Time: 1324 Rate: 76 Rhythm: normal sinus rhythm QRS Axis: normal Intervals: normal ST/T Wave abnormalities: normal Narrative Interpretation: no evidence of acute ischemia PROCEDURES: Critical Care performed: No .1-3 Lead EKG Interpretation  Performed by: Merwyn Katos, MD Authorized by: Merwyn Katos, MD     Interpretation: normal     ECG rate:  71   ECG rate assessment: normal     Rhythm: sinus rhythm     Ectopy: none     Conduction: normal    MEDICATIONS ORDERED IN ED: Medications - No data to display IMPRESSION / MDM / ASSESSMENT AND PLAN / ED COURSE  I reviewed the triage vital signs and the nursing notes.  The patient is on the cardiac monitor to evaluate for evidence of arrhythmia and/or significant heart rate changes. Patient's presentation is most consistent with acute presentation with potential threat to life or bodily function. Presents to the emergency department complaining of high blood pressure. Patient is otherwise asymptomatic without confusion, chest pain, hematuria, or SOB. Denies nonadherence to antihypertensive regimen DDx: CV, AMI, heart failure, renal infarction or failure or other end organ damage.  Disposition: Discussed with patient their elevated blood pressure and need for close outpatient management of their hypertension. Will provide a prescription for short acting antihypertensive with strict instructions for  administration and arrange for the patient to follow up in a primary care clinic    FINAL CLINICAL IMPRESSION(S) / ED DIAGNOSES   Final diagnoses:  Uncontrolled hypertension  Lightheadedness   Rx / DC Orders   ED Discharge Orders          Ordered    nitroGLYCERIN (NITROSTAT) 0.4 MG SL tablet  Every 5 min PRN        06/01/22 1517           Note:  This document was prepared using Dragon voice recognition software and may include unintentional dictation errors.   Merwyn Katos, MD 06/01/22 [phone]

## 2022-06-01 NOTE — ED Triage Notes (Signed)
Felt dizzy / woozy while at exercise class this morning.  States continues to feel dizzy when standing up.  AAOx3.  Skin warm and dry. NAD

## 2022-06-01 NOTE — ED Triage Notes (Signed)
Pt comes via [facility] from [facility] with c/o dizziness. Pt is hypertensive.  Pt BP 230/90 Pt does have hx HTN

## 2022-06-05 ENCOUNTER — Other Ambulatory Visit: Payer: Self-pay | Admitting: Internal Medicine

## 2022-06-05 DIAGNOSIS — R4189 Other symptoms and signs involving cognitive functions and awareness: Secondary | ICD-10-CM

## 2022-06-05 DIAGNOSIS — R42 Dizziness and giddiness: Secondary | ICD-10-CM

## 2022-06-05 DIAGNOSIS — H547 Unspecified visual loss: Secondary | ICD-10-CM

## 2022-06-05 DIAGNOSIS — G459 Transient cerebral ischemic attack, unspecified: Secondary | ICD-10-CM

## 2022-06-13 ENCOUNTER — Ambulatory Visit
Admission: RE | Admit: 2022-06-13 | Discharge: 2022-06-13 | Disposition: A | Discharge status: Home / Self Care | Payer: Medicare HMO | Source: Ambulatory Visit | Attending: Internal Medicine | Admitting: Internal Medicine

## 2022-06-13 DIAGNOSIS — G459 Transient cerebral ischemic attack, unspecified: Secondary | ICD-10-CM

## 2022-06-13 DIAGNOSIS — R42 Dizziness and giddiness: Secondary | ICD-10-CM | POA: Diagnosis present

## 2022-06-13 DIAGNOSIS — H547 Unspecified visual loss: Secondary | ICD-10-CM | POA: Diagnosis present

## 2022-06-13 DIAGNOSIS — R4189 Other symptoms and signs involving cognitive functions and awareness: Secondary | ICD-10-CM | POA: Insufficient documentation

## 2023-04-16 NOTE — Progress Notes (Signed)
 See telephone encounter on 04/14/23

## 2023-06-16 IMAGING — MG MM DIGITAL SCREENING BILAT W/ TOMO AND CAD
6 of 12 series · 6 of 36 positions shown · non-contrast
Comparison: Previous exam(s).

CLINICAL DATA: Screening.

EXAM:
DIGITAL SCREENING BILATERAL MAMMOGRAM WITH TOMOSYNTHESIS AND CAD
TECHNIQUE: Bilateral screening digital craniocaudal and mediolateral oblique
mammograms were obtained. Bilateral screening digital breast
tomosynthesis was performed. The images were evaluated with
computer-aided detection.

[L CC synth-2D]
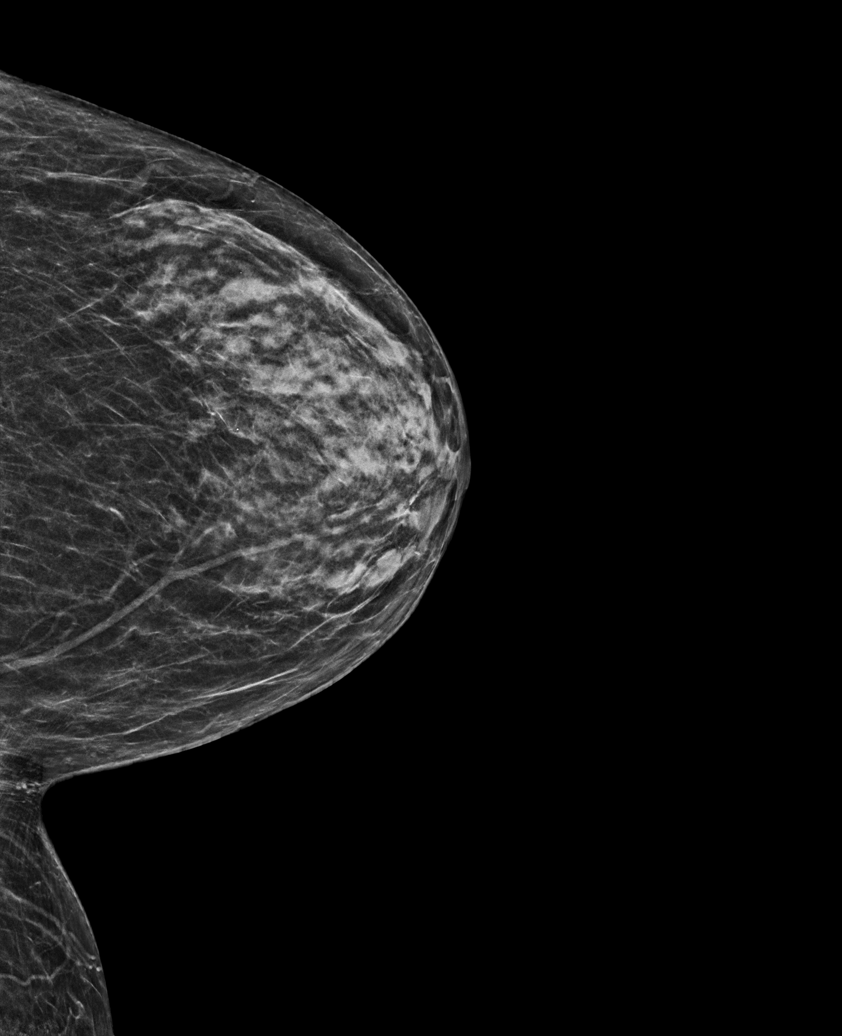

[R MLO synth-2D (1 of 2)]
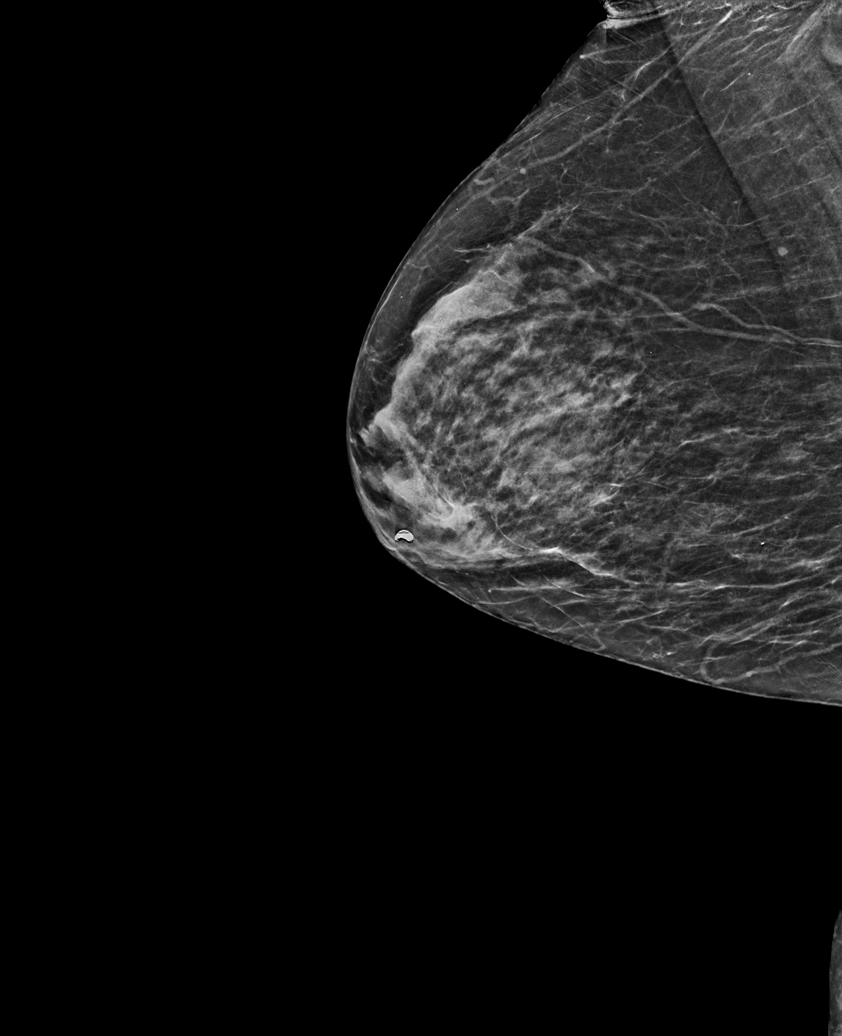

[L MLO synth-2D (1 of 2)]
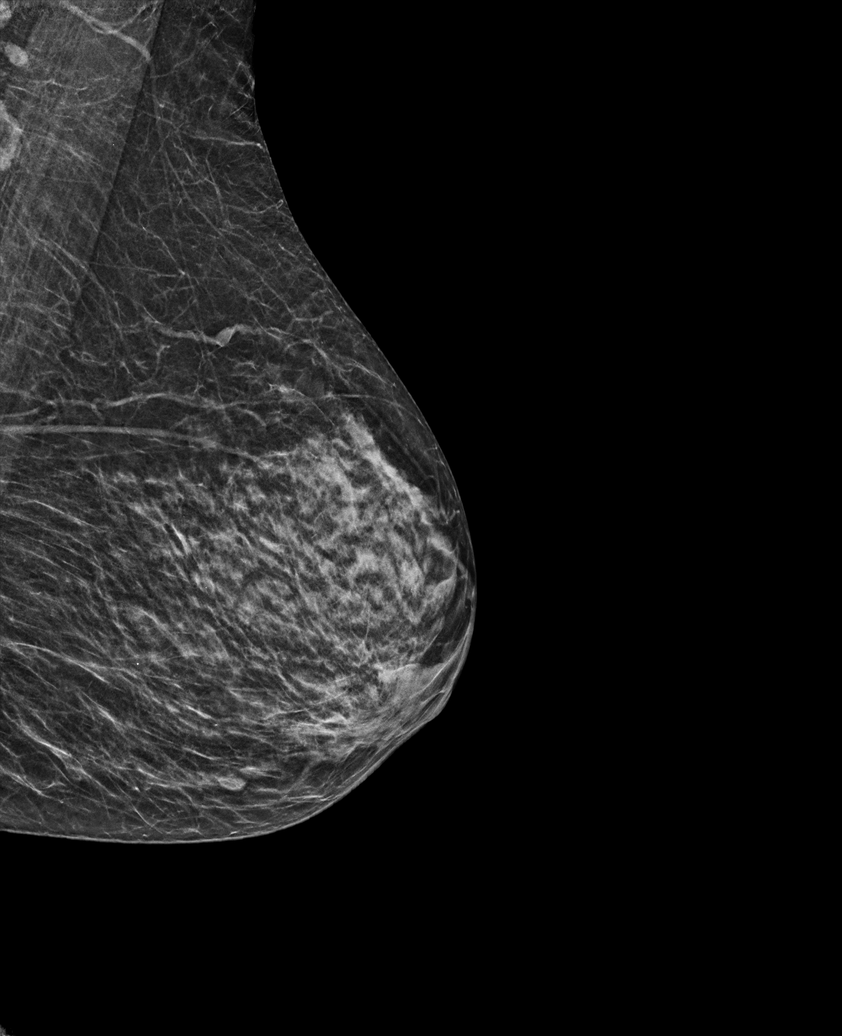

[L MLO synth-2D (2 of 2)]
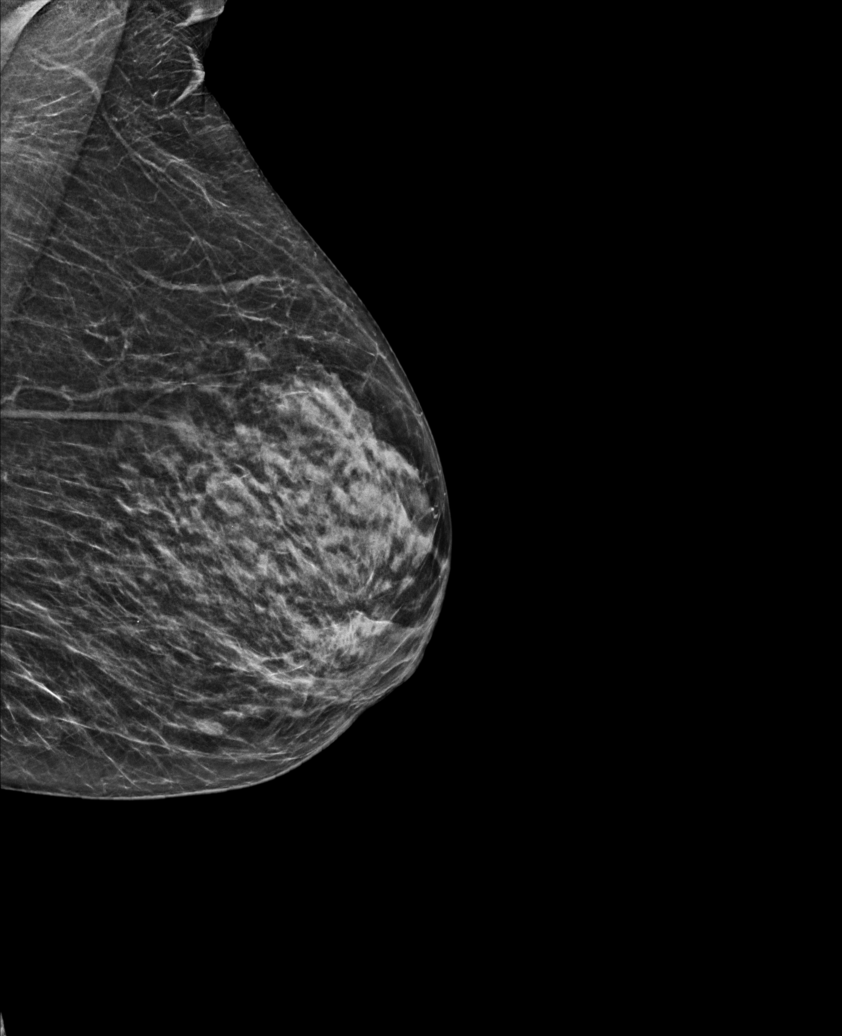

[R CC synth-2D]
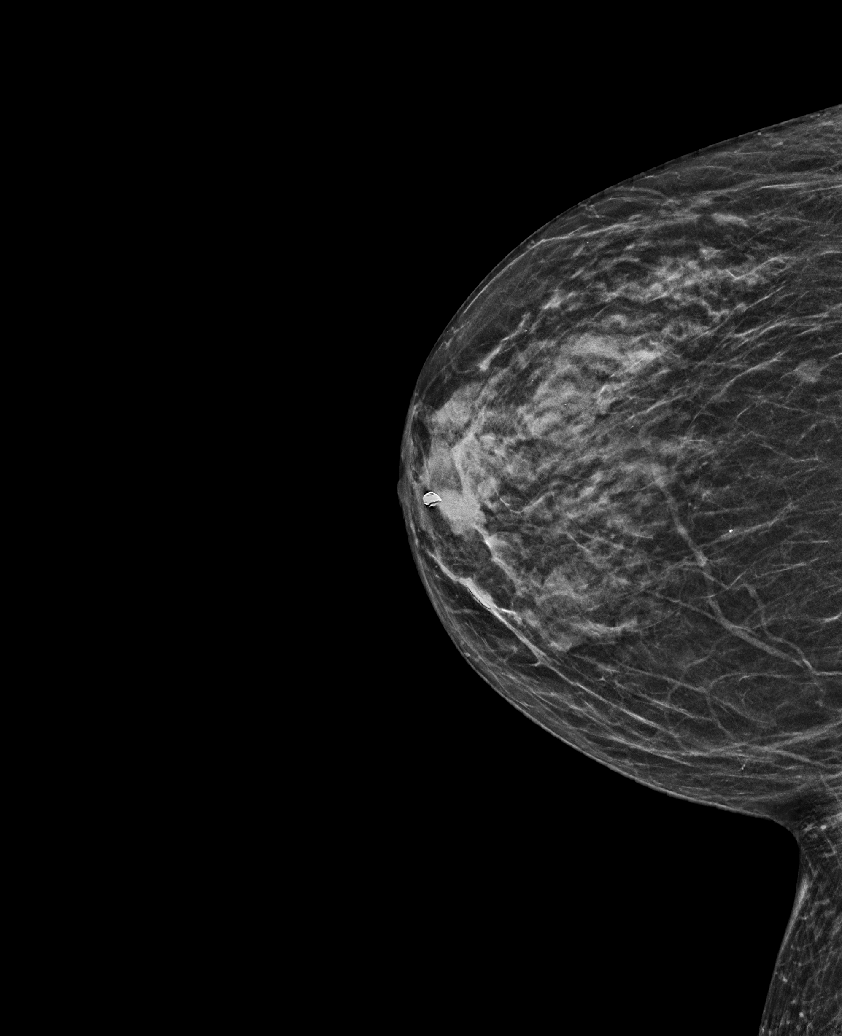

[R MLO synth-2D (2 of 2)]
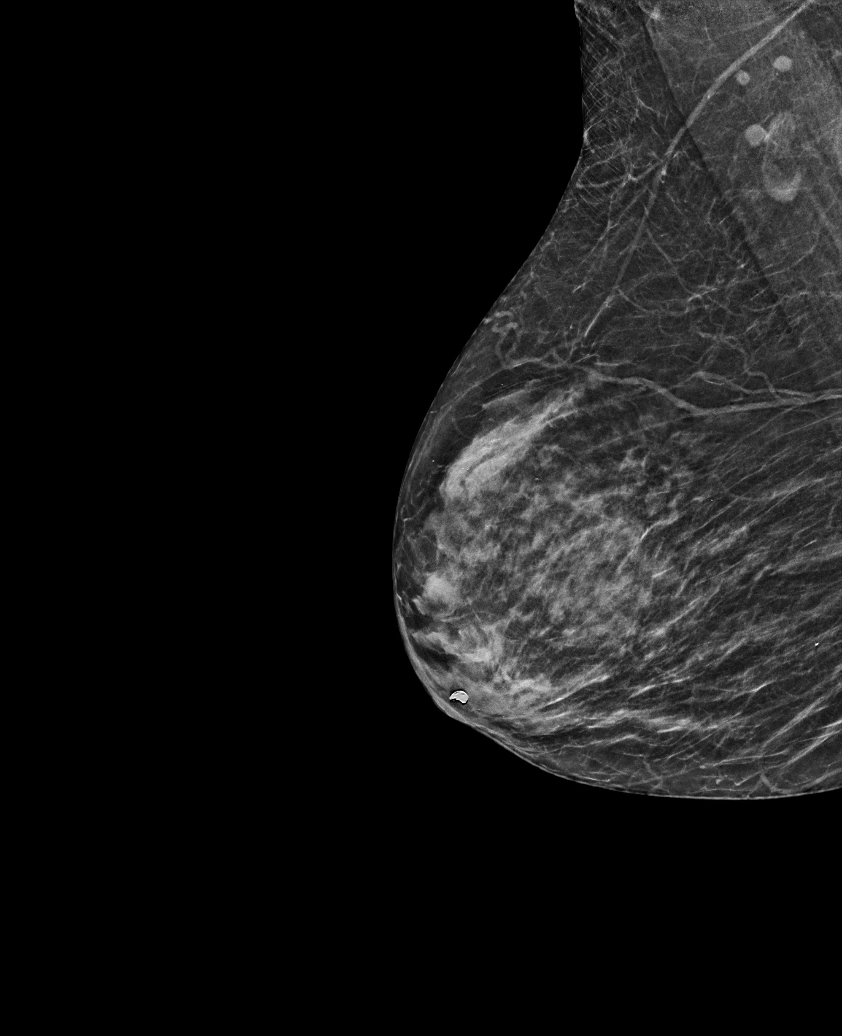

[6 of 36 positions shown; findings below may reference images not displayed]

ACR Breast Density Category c: The breast tissue is heterogeneously
dense, which may obscure small masses.
FINDINGS: There are no findings suspicious for malignancy.
IMPRESSION: No mammographic evidence of malignancy. A result letter of this
screening mammogram will be mailed directly to the patient.

RECOMMENDATION:
Screening mammogram in one year. (Code:Q3-W-BC3)

BI-RADS CATEGORY  1: Negative.

## 2023-09-26 ENCOUNTER — Observation Stay (HOSPITAL_BASED_OUTPATIENT_CLINIC_OR_DEPARTMENT_OTHER)
Admission: EM | Admit: 2023-09-26 | Discharge: 2023-09-27 | Disposition: A | Discharge status: Home / Self Care | Payer: PRIVATE HEALTH INSURANCE | Source: Home / Self Care | Attending: Emergency Medicine | Admitting: Emergency Medicine

## 2023-09-26 ENCOUNTER — Emergency Department: Payer: PRIVATE HEALTH INSURANCE

## 2023-09-26 ENCOUNTER — Other Ambulatory Visit: Payer: Self-pay

## 2023-09-26 DIAGNOSIS — Y92009 Unspecified place in unspecified non-institutional (private) residence as the place of occurrence of the external cause: Secondary | ICD-10-CM

## 2023-09-26 DIAGNOSIS — Z79899 Other long term (current) drug therapy: Secondary | ICD-10-CM | POA: Insufficient documentation

## 2023-09-26 DIAGNOSIS — K551 Chronic vascular disorders of intestine: Secondary | ICD-10-CM | POA: Diagnosis not present

## 2023-09-26 DIAGNOSIS — I480 Paroxysmal atrial fibrillation: Secondary | ICD-10-CM | POA: Insufficient documentation

## 2023-09-26 DIAGNOSIS — R55 Syncope and collapse: Principal | ICD-10-CM | POA: Diagnosis present

## 2023-09-26 DIAGNOSIS — S0003XA Contusion of scalp, initial encounter: Secondary | ICD-10-CM | POA: Insufficient documentation

## 2023-09-26 DIAGNOSIS — E860 Dehydration: Secondary | ICD-10-CM | POA: Insufficient documentation

## 2023-09-26 DIAGNOSIS — K922 Gastrointestinal hemorrhage, unspecified: Secondary | ICD-10-CM | POA: Diagnosis not present

## 2023-09-26 DIAGNOSIS — E782 Mixed hyperlipidemia: Secondary | ICD-10-CM | POA: Insufficient documentation

## 2023-09-26 DIAGNOSIS — W19XXXA Unspecified fall, initial encounter: Secondary | ICD-10-CM | POA: Insufficient documentation

## 2023-09-26 DIAGNOSIS — I10 Essential (primary) hypertension: Secondary | ICD-10-CM | POA: Insufficient documentation

## 2023-09-26 DIAGNOSIS — S0093XA Contusion of unspecified part of head, initial encounter: Secondary | ICD-10-CM

## 2023-09-26 LAB — URINALYSIS, ROUTINE W REFLEX MICROSCOPIC
Bilirubin Urine: NEGATIVE
Glucose, UA: NEGATIVE mg/dL
Hgb urine dipstick: NEGATIVE
Ketones, ur: NEGATIVE mg/dL
Leukocytes,Ua: NEGATIVE
Nitrite: NEGATIVE
Protein, ur: NEGATIVE mg/dL
Specific Gravity, Urine: 1.012 (ref 1.005–1.030)
pH: 6 (ref 5.0–8.0)

## 2023-09-26 LAB — CBC WITH DIFFERENTIAL/PLATELET
Abs Immature Granulocytes: 0.06 K/uL (ref 0.00–0.07)
Basophils Absolute: 0.1 K/uL (ref 0.0–0.1)
Basophils Relative: 0 %
Eosinophils Absolute: 0.2 K/uL (ref 0.0–0.5)
Eosinophils Relative: 2 %
HCT: 35.4 % — ABNORMAL LOW (ref 36.0–46.0)
Hemoglobin: 11.2 g/dL — ABNORMAL LOW (ref 12.0–15.0)
Immature Granulocytes: 1 %
Lymphocytes Relative: 27 %
Lymphs Abs: 3.1 K/uL (ref 0.7–4.0)
MCH: 29.9 pg (ref 26.0–34.0)
MCHC: 31.6 g/dL (ref 30.0–36.0)
MCV: 94.7 fL (ref 80.0–100.0)
Monocytes Absolute: 1 K/uL (ref 0.1–1.0)
Monocytes Relative: 9 %
Neutro Abs: 6.8 K/uL (ref 1.7–7.7)
Neutrophils Relative %: 61 %
Platelets: 210 K/uL (ref 150–400)
RBC: 3.74 MIL/uL — ABNORMAL LOW (ref 3.87–5.11)
RDW: 13.9 % (ref 11.5–15.5)
WBC: 11.3 K/uL — ABNORMAL HIGH (ref 4.0–10.5)
nRBC: 0 % (ref 0.0–0.2)

## 2023-09-26 LAB — COMPREHENSIVE METABOLIC PANEL WITH GFR
ALT: 11 U/L (ref 0–44)
AST: 31 U/L (ref 15–41)
Albumin: 3.5 g/dL (ref 3.5–5.0)
Alkaline Phosphatase: 56 U/L (ref 38–126)
Anion gap: 8 (ref 5–15)
BUN: 42 mg/dL — ABNORMAL HIGH (ref 8–23)
CO2: 23 mmol/L (ref 22–32)
Calcium: 8.6 mg/dL — ABNORMAL LOW (ref 8.9–10.3)
Chloride: 106 mmol/L (ref 98–111)
Creatinine, Ser: 0.66 mg/dL (ref 0.44–1.00)
GFR, Estimated: >60 mL/min (ref >60)
Glucose, Bld: 154 mg/dL — ABNORMAL HIGH (ref 70–99)
Potassium: 4.6 mmol/L (ref 3.5–5.1)
Sodium: 137 mmol/L (ref 135–145)
Total Bilirubin: 0.7 mg/dL (ref 0.0–1.2)
Total Protein: 7.1 g/dL (ref 6.5–8.1)

## 2023-09-26 LAB — TROPONIN I (HIGH SENSITIVITY)
Troponin I (High Sensitivity): 11 ng/L (ref <18)
Troponin I (High Sensitivity): 13 ng/L (ref <18)

## 2023-09-26 MED ORDER — OYSTER SHELL CALCIUM/D3 500-5 MG-MCG PO TABS
1.0000 | ORAL_TABLET | Freq: Two times a day (BID) | ORAL | Status: DC
  Administered 2023-09-26 – 2023-09-27 (×2): 1 via ORAL
  Filled 2023-09-26 (×2): qty 1

## 2023-09-26 MED ORDER — LACTATED RINGERS IV SOLN: INTRAVENOUS | Status: AC

## 2023-09-26 MED ORDER — SODIUM CHLORIDE 0.9 % IV BOLUS
500.0000 mL | Freq: Once | INTRAVENOUS | Status: AC
  Administered 2023-09-26: 500 mL via INTRAVENOUS

## 2023-09-26 MED ORDER — ACETAMINOPHEN 500 MG PO TABS
1000.0000 mg | ORAL_TABLET | Freq: Once | ORAL | Status: AC
  Administered 2023-09-26: 1000 mg via ORAL
  Filled 2023-09-26: qty 2

## 2023-09-26 MED ORDER — AMLODIPINE BESYLATE 5 MG PO TABS
2.5000 mg | ORAL_TABLET | Freq: Every day | ORAL | Status: DC
  Administered 2023-09-27: 2.5 mg via ORAL
  Filled 2023-09-26: qty 1

## 2023-09-26 MED ORDER — ONDANSETRON HCL 4 MG/2ML IJ SOLN: 4.0000 mg | Freq: Four times a day (QID) | INTRAMUSCULAR | Status: DC | PRN

## 2023-09-26 MED ORDER — OMEGA-3-ACID ETHYL ESTERS 1 G PO CAPS
1.0000 g | ORAL_CAPSULE | Freq: Two times a day (BID) | ORAL | Status: DC
  Administered 2023-09-26 – 2023-09-27 (×2): 1 g via ORAL
  Filled 2023-09-26 (×2): qty 1

## 2023-09-26 MED ORDER — PRAVASTATIN SODIUM 20 MG PO TABS: 20.0000 mg | ORAL_TABLET | Freq: Every day | ORAL | Status: DC

## 2023-09-26 MED ORDER — TETANUS-DIPHTH-ACELL PERTUSSIS 5-2.5-18.5 LF-MCG/0.5 IM SUSY
0.5000 mL | PREFILLED_SYRINGE | Freq: Once | INTRAMUSCULAR | Status: AC
  Administered 2023-09-26: 0.5 mL via INTRAMUSCULAR
  Filled 2023-09-26: qty 0.5

## 2023-09-26 MED ORDER — LISINOPRIL 20 MG PO TABS
30.0000 mg | ORAL_TABLET | Freq: Every day | ORAL | Status: DC
  Administered 2023-09-27: 30 mg via ORAL
  Filled 2023-09-26: qty 1

## 2023-09-26 MED ORDER — ONDANSETRON HCL 4 MG PO TABS: 4.0000 mg | ORAL_TABLET | Freq: Four times a day (QID) | ORAL | Status: DC | PRN
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
## 2023-09-26 NOTE — ED Provider Notes (Signed)
 [facility] Provider Note    Event Date/Time   First MD Initiated Contact with Patient 09/26/23 1519     (approximate)   History   Fall   HPI  Shamina LISBETH PULLER is a 89 y.o. female who comes in for syncopal episode.  Patient reports that she felt fine this morning and that she went to go sit down and falling asleep and when she woke up she had gone back into the kitchen to start her crockpot when she felt a wave come over her and she blacked out and landed on the ground.  Did hit the top of her head.  Unclear what her last tetanus was.  Has not taken anything before coming in.  Denies any chest pain, shortness of breath, abdominal pain hip pain or any other injuries.  Physical Exam   Triage Vital Signs: ED Triage Vitals  Encounter Vitals Group     BP 09/26/23 1530 (!) 152/72     Girls Systolic BP Percentile --      Girls Diastolic BP Percentile --      Boys Systolic BP Percentile --      Boys Diastolic BP Percentile --      Pulse Rate 09/26/23 1530 97     Resp 09/26/23 1530 16     Temp 09/26/23 1530 97.9 F (36.6 C)     Temp Source 09/26/23 1530 Oral     SpO2 09/26/23 1530 99 %     Weight 09/26/23 1531 108 lb (49 kg)     Height 09/26/23 1531 5' (1.524 m)     Head Circumference --      Peak Flow --      Pain Score 09/26/23 1531 1     Pain Loc --      Pain Education --      Exclude from Growth Chart --     Most recent vital signs: Vitals:   09/26/23 1530  BP: (!) 152/72  Pulse: 97  Resp: 16  Temp: 97.9 F (36.6 C)  SpO2: 99%     General: Awake, no distress.  CV:  Good peripheral perfusion.  Resp:  Normal effort.  Abd:  No distention.  Other:  Hematoma to the back of the head.  No chest wall tenderness no abdominal tenderness.  Full range of motion of all extremities.   ED Results / Procedures / Treatments   Labs (all labs ordered are listed, but only abnormal results are displayed) Labs Reviewed  CBC WITH DIFFERENTIAL/PLATELET -  Abnormal; Notable for the following components:      Result Value   WBC 11.3 (*)    RBC 3.74 (*)    Hemoglobin 11.2 (*)    HCT 35.4 (*)    All other components within normal limits  COMPREHENSIVE METABOLIC PANEL WITH GFR - Abnormal; Notable for the following components:   Glucose, Bld 154 (*)    BUN 42 (*)    Calcium  8.6 (*)    All other components within normal limits  URINALYSIS, ROUTINE W REFLEX MICROSCOPIC  TROPONIN I (HIGH SENSITIVITY)     EKG  My interpretation of EKG:  atrial fibrillation with a rate of 95 any ST elevation or T wave inversions, normal intervals  RADIOLOGY I have reviewed the CT personally and interpreted no ICH    PROCEDURES:  Critical Care performed: No  Procedures   MEDICATIONS ORDERED IN ED: Medications - No data to display   IMPRESSION / MDM /  ASSESSMENT AND PLAN / ED COURSE  I reviewed the triage vital signs and the nursing notes.   Patient's presentation is most consistent with acute presentation with potential threat to life or bodily function.   Patient comes in for a syncopal episode.  Patient found to be in new A-fib.  She denies any history of this.  Discussed with family that this has higher risk for strokes but at this time amenable to off on anticoagulation given the report of fall and hematoma to the head CT imaging was negative.  Initial cardiac marker is negative.  Blood work does show hemoglobin of 11.2.  CMP shows slightly elevated BUN patient given some fluids.  Discussed with patient that syncopal episode could have been from dehydration but given new A-fib and patient's age and no prior history of syncope will discuss with hospitalist for admission for cardiac monitoring, echocardiogram.  The patient is on the cardiac monitor to evaluate for evidence of arrhythmia and/or significant heart rate changes.      FINAL CLINICAL IMPRESSION(S) / ED DIAGNOSES   Final diagnoses:  Syncope, unspecified syncope type  Fall,  initial encounter  Contusion of head, unspecified part of head, initial encounter     Rx / DC Orders   ED Discharge Orders     None        Note:  This document was prepared using Dragon voice recognition software and may include unintentional dictation errors.   Ernest Ronal BRAVO, MD 09/26/23 [phone]

## 2023-09-26 NOTE — ED Triage Notes (Signed)
 Pt from home via ems- had mechanical fall at home today, hit head- hematoma noted on top on head in the front. PT denies other injury/pain at this time. Pt takes aspirin, no thinners, no LOC. A-fibb on monitor. BGL- 150

## 2023-09-27 ENCOUNTER — Observation Stay

## 2023-09-27 ENCOUNTER — Observation Stay
Admit: 2023-09-27 | Discharge: 2023-09-27 | Disposition: A | Discharge status: Home / Self Care | Attending: Internal Medicine

## 2023-09-27 DIAGNOSIS — E782 Mixed hyperlipidemia: Secondary | ICD-10-CM | POA: Diagnosis not present

## 2023-09-27 DIAGNOSIS — I480 Paroxysmal atrial fibrillation: Secondary | ICD-10-CM | POA: Diagnosis not present

## 2023-09-27 DIAGNOSIS — I10 Essential (primary) hypertension: Secondary | ICD-10-CM | POA: Diagnosis not present

## 2023-09-27 DIAGNOSIS — R55 Syncope and collapse: Secondary | ICD-10-CM | POA: Diagnosis not present

## 2023-09-27 LAB — ECHOCARDIOGRAM COMPLETE
AR max vel: 2.25 cm2
AV Peak grad: 8 mmHg
Ao pk vel: 1.42 m/s
Area-P 1/2: 3.53 cm2
Height: 60 in
MV VTI: 2.13 cm2
P 1/2 time: 533 ms
S' Lateral: 2.1 cm
Weight: 1728 [oz_av]

## 2023-09-27 LAB — CBC
HCT: 27.5 % — ABNORMAL LOW (ref 36.0–46.0)
Hemoglobin: 9 g/dL — ABNORMAL LOW (ref 12.0–15.0)
MCH: 30.4 pg (ref 26.0–34.0)
MCHC: 32.7 g/dL (ref 30.0–36.0)
MCV: 92.9 fL (ref 80.0–100.0)
Platelets: 169 K/uL (ref 150–400)
RBC: 2.96 MIL/uL — ABNORMAL LOW (ref 3.87–5.11)
RDW: 13.9 % (ref 11.5–15.5)
WBC: 7.5 K/uL (ref 4.0–10.5)
nRBC: 0 % (ref 0.0–0.2)

## 2023-09-27 LAB — COMPREHENSIVE METABOLIC PANEL WITH GFR
ALT: 14 U/L (ref 0–44)
AST: 21 U/L (ref 15–41)
Albumin: 2.8 g/dL — ABNORMAL LOW (ref 3.5–5.0)
Alkaline Phosphatase: 45 U/L (ref 38–126)
Anion gap: 5 (ref 5–15)
BUN: 33 mg/dL — ABNORMAL HIGH (ref 8–23)
CO2: 26 mmol/L (ref 22–32)
Calcium: 8.3 mg/dL — ABNORMAL LOW (ref 8.9–10.3)
Chloride: 109 mmol/L (ref 98–111)
Creatinine, Ser: 0.71 mg/dL (ref 0.44–1.00)
GFR, Estimated: >60 mL/min (ref >60)
Glucose, Bld: 101 mg/dL — ABNORMAL HIGH (ref 70–99)
Potassium: 3.9 mmol/L (ref 3.5–5.1)
Sodium: 140 mmol/L (ref 135–145)
Total Bilirubin: 0.7 mg/dL (ref 0.0–1.2)
Total Protein: 5.6 g/dL — ABNORMAL LOW (ref 6.5–8.1)

## 2023-09-27 LAB — PHOSPHORUS: Phosphorus: 2.8 mg/dL (ref 2.5–4.6)

## 2023-09-27 LAB — MAGNESIUM: Magnesium: 1.8 mg/dL (ref 1.7–2.4)

## 2023-09-27 NOTE — TOC Transition Note (Signed)
 Transition of Care [facility]) - Discharge Note   Patient Details  Name: Stacey Lee MRN: [id number] Date of Birth: 1930-12-11  Transition of Care [facility]) CM/SW Contact:  Marinda Cooks, RN Phone Number: 09/27/2023, 3:31 PM   Clinical Narrative:    This CM updated by covering MD pt medically cleared to dc today and has active DC order . This CM spoke with Channing  at [facility] arranged . DC transportation confirmed for pt with [facility] team updated . No additional DC needs requested by medical team or identified by CM at this time .     Final next level of care: Home w Home Health Services Barriers to Discharge: No Barriers Identified   P Choice offered to / list presented to : Patient, Adult Children     Name of family member notified: Pt's Daughter Kandy Rouse) [phone] Patient and family notified of of transfer: 09/27/23  Discharge Plan and Services Additional resources added to the After Visit Summary for                            [facility] Arranged: PT, OT HH Agency: [facility] Date [facility] Agency Contacted: 09/27/23 Time HH Agency Contacted: 1530 Representative spoke with at [facility] Agency: Channing  Social Drivers of Health (SDOH) Interventions SDOH Screenings   Food Insecurity: No Food Insecurity (09/26/2023)  Housing: Low Risk  (09/26/2023)  Recent Concern: Housing - High Risk (09/23/2023)   Received from [facility]  Transportation Needs: No Transportation Needs (09/26/2023)  Utilities: Not At Risk (09/26/2023)  Financial Resource Strain: Low Risk  (09/23/2023)   Received from [facility]  Tobacco Use: Low Risk  (09/26/2023)     Readmission Risk Interventions     No data to display

## 2023-09-27 NOTE — Evaluation (Addendum)
 Physical Therapy Evaluation Patient Details Name: Stacey Lee MRN: [id number] DOB: 11-26-1930 Today's Date: 09/27/2023  History of Present Illness  Pt is a 89 yo female that presented to the ED  for syncope. Workup showed afib. PMH of HLD, HTN.  Clinical Impression  Pt A&Ox4, family at bedside. Denied pain. Per pt PTA she is independent, lives alone. Her son comes every Thursday for appts (hair, grocery shopping), provides transportation, denies any other falls. She was able tp erform bed mobility modI, transfers and ambulation at a CGA-supervision level, no AD. Did display decreased gait velocity and confidence in ambulation, pt endorsed she was being cautious. Returned to room and sitting EOB with OT. Pt demonstrated near return to baseline level of functioning, but recommend follow up skilled PT intervention to maximize safety and ensure full return to PLOF.      HR 90s-127 BPM with mobility, RN notified, pt without complaints.    If plan is discharge home, recommend the following: Assist for transportation   Can travel by private vehicle        Equipment Recommendations None recommended by PT  Recommendations for Other Services       Functional Status Assessment Patient has had a recent decline in their functional status and demonstrates the ability to make significant improvements in function in a reasonable and predictable amount of time.     Precautions / Restrictions Precautions Precautions: Fall Restrictions Weight Bearing Restrictions Per Provider Order: No      Mobility  Bed Mobility Overal bed mobility: Modified Independent                  Transfers Overall transfer level: Needs assistance Equipment used: None Transfers: Sit to/from Stand Sit to Stand: Supervision, Contact guard assist           General transfer comment: CGA from EOB, supervision to return    Ambulation/Gait   Gait Distance (Feet): 200 Feet Assistive device: None   Gait  velocity: decreased     General Gait Details: step through reciprocal pattern, slowed per pt being cautious  Stairs            Wheelchair Mobility     Tilt Bed    Modified Rankin (Stroke Patients Only)       Balance Overall balance assessment: Needs assistance Sitting-balance support: Feet supported Sitting balance-Leahy Scale: Good       Standing balance-Leahy Scale: Good                               Pertinent Vitals/Pain Pain Assessment Pain Assessment: No/denies pain    Home Living Family/patient expects to be discharged to:: Private residence Living Arrangements: Alone Available Help at Discharge: Family;Available PRN/intermittently Type of Home: House Home Access: Stairs to enter Entrance Stairs-Rails: Right Entrance Stairs-Number of Steps: 10 steps, or 2 without hand support at front   Home Layout: Laundry or work area in basement;Able to live on main level with bedroom/bathroom Home Equipment: Agricultural consultant (2 wheels);Cane - single point;Shower seat      Prior Function Prior Level of Function : Independent/Modified Independent             Mobility Comments: son comes every thursday for appts, hair appts, take her grocery shopping. ADLs Comments: independent, does her own laundry, cooking     Extremity/Trunk Assessment   Upper Extremity Assessment Upper Extremity Assessment: Overall WFL for tasks assessed    Lower  Extremity Assessment Lower Extremity Assessment: Overall WFL for tasks assessed       Communication   Communication Communication: No apparent difficulties    Cognition Arousal: Alert Behavior During Therapy: WFL for tasks assessed/performed   PT - Cognitive impairments: No apparent impairments                         Following commands: Intact       Cueing Cueing Techniques: Verbal cues     General Comments General comments (skin integrity, edema, etc.): HR in 110's - 120's with  standing grooming tasks at sink and in room mobility    Exercises     Assessment/Plan    PT Assessment Patient needs continued PT services  PT Problem List Decreased activity tolerance;Decreased balance;Decreased mobility       PT Treatment Interventions Balance training;Gait training;Neuromuscular re-education;Stair training;Therapeutic activities;Functional mobility training;Patient/family education;Therapeutic exercise    PT Goals (Current goals can be found in the Care Plan section)  Acute Rehab PT Goals Patient Stated Goal: to go home PT Goal Formulation: With patient Time For Goal Achievement: 10/11/23 Potential to Achieve Goals: Good    Frequency Min 2X/week     Co-evaluation               AM-PAC PT 6 Clicks Mobility  Outcome Measure Help needed turning from your back to your side while in a flat bed without using bedrails?: None Help needed moving from lying on your back to sitting on the side of a flat bed without using bedrails?: None Help needed moving to and from a bed to a chair (including a wheelchair)?: None Help needed standing up from a chair using your arms (e.g., wheelchair or bedside chair)?: None Help needed to walk in hospital room?: None Help needed climbing 3-5 steps with a railing? : None 6 Click Score: 24    End of Session Equipment Utilized During Treatment: Gait belt Activity Tolerance: Patient tolerated treatment well Patient left: Other (comment) (seated EOB with OT)   PT Visit Diagnosis: Other abnormalities of gait and mobility (R26.89);Difficulty in walking, not elsewhere classified (R26.2);Muscle weakness (generalized) (M62.81)    Time: 9184-9173 PT Time Calculation (min) (ACUTE ONLY): 11 min   Charges:   PT Evaluation $PT Eval Low Complexity: 1 Low   PT General Charges $$ ACUTE PT VISIT: 1 Visit        Doyal Shams PT, DPT 8:49 AM,09/27/23

## 2023-09-27 NOTE — Progress Notes (Signed)
  Echocardiogram 2D Echocardiogram has been performed.  Stacey Lee 09/27/2023, 12:00 PM

## 2023-09-27 NOTE — Evaluation (Signed)
 Occupational Therapy Evaluation Patient Details Name: Stacey Lee MRN: [id number] DOB: [birth year] Today's Date: 09/27/2023   History of Present Illness   Pt is a 89 yo female that presented to the ED  for syncope. Workup showed afib. PMH of HLD, HTN.     Clinical Impressions Pt was seen for OT evaluation this date. Prior to hospital admission, pt was independent with ADL and had assist from her son for IADL tasks such as assisting at the grocery store and taking her to hair appts. Pt lives alone in a 1 story home with laundry in the basement that she regularly uses. Pt presents with deficits in cardiopulmonary status (HR in 120's with limited mobility in room with OT, also elevated with mobility in the hall with PT just prior), affecting safe and optimal ADL completion. Pt currently requires supv for ADL mobility and standing grooming at the sink. Educated in gradual return to PLOF and use of shower chair to support energy conservation and safety with bathing. Pt/son verbalized understanding. Do not anticipate the need for additional follow up OT services upon acute hospital DC. Will sign off.     If plan is discharge home, recommend the following:   Assistance with cooking/housework;Assist for transportation;Help with stairs or ramp for entrance     Functional Status Assessment   Patient has had a recent decline in their functional status and demonstrates the ability to make significant improvements in function in a reasonable and predictable amount of time.     Equipment Recommendations   None recommended by OT     Recommendations for Other Services         Precautions/Restrictions   Precautions Precautions: Fall Restrictions Weight Bearing Restrictions Per Provider Order: No     Mobility Bed Mobility Overal bed mobility: Modified Independent                  Transfers Overall transfer level: Needs assistance Equipment used: None Transfers: Sit to/from  Stand Sit to Stand: Supervision, Contact guard assist                  Balance Overall balance assessment: Needs assistance Sitting-balance support: Feet supported Sitting balance-Leahy Scale: Good     Standing balance support: During functional activity Standing balance-Leahy Scale: Good                             ADL either performed or assessed with clinical judgement   ADL Overall ADL's : Modified independent                                       General ADL Comments: Pt completed grooming tasks at the sink wiht supv while monitoring HR. Tolerated well. Per PT, had completed toileting tasks prior to therapy arrival without assist.     Vision         Perception         Praxis         Pertinent Vitals/Pain Pain Assessment Pain Assessment: No/denies pain     Extremity/Trunk Assessment Upper Extremity Assessment Upper Extremity Assessment: Overall WFL for tasks assessed   Lower Extremity Assessment Lower Extremity Assessment: Overall WFL for tasks assessed       Communication Communication Communication: No apparent difficulties   Cognition Arousal: Alert Behavior During Therapy: WFL for tasks assessed/performed Cognition: No apparent  impairments                               Following commands: Intact       Cueing  General Comments   Cueing Techniques: Verbal cues  HR in 110's - 120's with standing grooming tasks at sink and in room mobility   Exercises Other Exercises Other Exercises: Pt/son educated in activity pacing and gradual return to PLOF   Shoulder Instructions      Home Living Family/patient expects to be discharged to:: Private residence Living Arrangements: Alone Available Help at Discharge: Family;Available PRN/intermittently Type of Home: House Home Access: Stairs to enter [employer] of Steps: 10 steps, or 2 without hand support at front Entrance Stairs-Rails:  Right Home Layout: Laundry or work area in basement;Able to live on main level with bedroom/bathroom     Bathroom Shower/Tub: Chief Strategy Officer: Standard     Home Equipment: Agricultural consultant (2 wheels);Cane - single point;Shower seat          Prior Functioning/Environment Prior Level of Function : Independent/Modified Independent             Mobility Comments: son comes every thursday for appts, hair appts, take her grocery shopping ADLs Comments: independent    OT Problem List: Impaired balance (sitting and/or standing);Cardiopulmonary status limiting activity   OT Treatment/Interventions:        OT Goals(Current goals can be found in the care plan section)   Acute Rehab OT Goals Patient Stated Goal: return to PLOF OT Goal Formulation: All assessment and education complete, DC therapy   OT Frequency:       Co-evaluation              AM-PAC OT 6 Clicks Daily Activity     Outcome Measure Help from another person eating meals?: None Help from another person taking care of personal grooming?: None Help from another person toileting, which includes using toliet, bedpan, or urinal?: None Help from another person bathing (including washing, rinsing, drying)?: A Little Help from another person to put on and taking off regular upper body clothing?: None Help from another person to put on and taking off regular lower body clothing?: None 6 Click Score: 23   End of Session Nurse Communication: Mobility status;Other (comment) (questions about her eye drops)  Activity Tolerance: Patient tolerated treatment well Patient left: in chair;with call bell/phone within reach;with chair alarm set;with family/visitor present  OT Visit Diagnosis: Unsteadiness on feet (R26.81);History of falling (Z91.81)                Time: 9171-9162 OT Time Calculation (min): 9 min Charges:  OT General Charges $OT Visit: 1 Visit OT Evaluation $OT Eval Low Complexity: 1  Low  Warren SAUNDERS., MPH, MS, OTR/L ascom [phone] 09/27/23, 8:48 AM
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
## 2023-09-27 NOTE — Progress Notes (Signed)
 Mobility Specialist - Progress Note     09/27/23 1101  Mobility  Activity Ambulated with assistance;Stood at bedside  Level of Assistance Standby assist, set-up cues, supervision of patient - no hands on  Assistive Device Other (Comment) (HHA)  Distance Ambulated (ft) 320 ft  Range of Motion/Exercises Active  Activity Response Tolerated well  Mobility Referral Yes  Mobility visit 1 Mobility  Mobility Specialist Start Time (ACUTE ONLY) 1041  Mobility Specialist Stop Time (ACUTE ONLY) 1057  Mobility Specialist Time Calculation (min) (ACUTE ONLY) 16 min   Pt resting in bed on RA upon entry. Pt STS and ambulates to hallway around NS for 2 laps SBA with no AD HHA. Pt endorses no pain or weakness during ambulation. Pt returned to bed and left with needs in reach.   Guido Rumble Mobility Specialist 09/27/23, 11:14 AM

## 2023-09-27 NOTE — Consult Note (Signed)
 Stacey Lee is a 89 y.o. female  [id number]  Primary Cardiologist: [facility] cardiology Reason for Consultation: Syncope  HPI: This is a 89 year old white female who is doing fine until yesterday when she all of a sudden felt dizzy lightheaded and fell on the floor and passed out.  She denies any chest pain or shortness of breath or nausea or vomiting.  CT of the head without contrast showed no acute abnormality but there is a left paramedian anterior frontal scalp hematoma without any fracture.  She was found to be in atrial fibrillation thus I was asked to evaluate the patient.  At this time patient denies any symptoms and is eating breakfast.   Review of Systems: No chest pain or palpitation   Past Medical History:  Diagnosis Date   Glaucoma    Hyperlipidemia    Hypertension     Medications Prior to Admission  Medication Sig Dispense Refill   amLODipine  (NORVASC ) 2.5 MG tablet TAKE 1 TABLET BY MOUTH ONCE DAILY     Calcium  Carb-Cholecalciferol  (CALCIUM  600 + D PO) Take 1 tablet by mouth 2 (two) times daily.     Coenzyme Q10 (CO Q 10) 100 MG CAPS Take 1 tablet by mouth daily.     ibuprofen (ADVIL,MOTRIN) 200 MG tablet Take 400 mg by mouth every 6 (six) hours as needed for headache or moderate pain.     lisinopril  (PRINIVIL ,ZESTRIL ) 30 MG tablet Take 30 mg by mouth daily.     lovastatin (MEVACOR) 20 MG tablet Take 20 mg by mouth 3 (three) times a week. Monday, Wednesday, and Friday     Multiple Vitamin (MULTIVITAMIN WITH MINERALS) TABS tablet Take 1 tablet by mouth daily.     nitroGLYCERIN  (NITROSTAT ) 0.4 MG SL tablet Place 1 tablet (0.4 mg total) under the tongue every 5 (five) minutes as needed (hypertension). Please only use for symptoms of lightheadedness AND blood pressure above 150 on top AND above 100 on the bottom 100 tablet 3   Omega-3 Fatty Acids (FISH OIL) 1000 MG CAPS Take 1 capsule by mouth 2 (two) times daily.     prednisoLONE  acetate (PRED FORTE ) 1 % ophthalmic suspension  Place 1 drop into the left eye daily.     Red Yeast Rice 600 MG CAPS Take 1 capsule by mouth 2 (two) times daily.     RESTASIS 0.05 % ophthalmic emulsion INSTILL 1 DROP INTO EACH EYE TWICE DAILY  12      amLODipine   2.5 mg Oral Daily   calcium -vitamin D  1 tablet Oral BID   lisinopril   30 mg Oral Daily   omega-3 acid ethyl esters  1 g Oral BID   pravastatin   20 mg Oral q1800    Infusions:   Allergies  Allergen Reactions   Acetanilide Shortness Of Breath and Nausea Only   Acetazolamide Nausea Only and Other (See Comments)    Other Reaction: SOB, dizzy (po) Other Reaction: SOB, dizzy (po)    Penicillins Swelling    Social History   Socioeconomic History   Marital status: Married    Spouse name: Not on file   Number of children: Not on file   Years of education: Not on file   Highest education level: Not on file  Occupational History   Not on file  Tobacco Use   Smoking status: Never   Smokeless tobacco: Never  Vaping Use   Vaping status: Never Used  Substance and Sexual Activity   Alcohol use: Not Currently  Drug use: Never   Sexual activity: Not on file  Other Topics Concern   Not on file  Social History Narrative   Not on file   Social Drivers of Health   Financial Resource Strain: Low Risk  (09/23/2023)   Received from [facility]   Overall Financial Resource Strain (CARDIA)    Difficulty of Paying Living Expenses: Not hard at all  Food Insecurity: No Food Insecurity (09/26/2023)   Hunger Vital Sign    Worried About Running Out of Food in the Last Year: Never true    Ran Out of Food in the Last Year: Never true  Transportation Needs: No Transportation Needs (09/26/2023)   PRAPARE - Administrator, Civil Service (Medical): No    Lack of Transportation (Non-Medical): No  Physical Activity: Not on file  Stress: Not on file  Social Connections: Not on file  Intimate Partner Violence: Not At Risk (09/26/2023)   Humiliation,  Afraid, Rape, and Kick questionnaire    Fear of Current or Ex-Partner: No    Emotionally Abused: No    Physically Abused: No    Sexually Abused: No    Family History  Problem Relation Age of Onset   Prostate cancer Father    Tuberculosis Maternal Grandfather    Breast cancer Neg Hx    Kidney cancer Neg Hx    Bladder Cancer Neg Hx     PHYSICAL EXAM: Vitals:   09/27/23 0423 09/27/23 0800  BP: (!) 104/57 122/63  Pulse: 71 83  Resp: 16 18  Temp: 97.8 F (36.6 C) 98 F (36.7 C)  SpO2: 100% 96%     Intake/Output Summary (Last 24 hours) at 09/27/2023 1113 Last data filed at 09/27/2023 0400 Gross per 24 hour  Intake 820.71 ml  Output --  Net 820.71 ml    General:  Well appearing. No respiratory difficulty HEENT: normal Neck: supple. no JVD. Carotids 2+ bilat; no bruits. No lymphadenopathy or thryomegaly appreciated. Cor: PMI nondisplaced. Regular rate & rhythm. No rubs, gallops or murmurs. Lungs: clear Abdomen: soft, nontender, nondistended. No hepatosplenomegaly. No bruits or masses. Good bowel sounds. Extremities: no cyanosis, clubbing, rash, edema Neuro: alert & oriented x 3, cranial nerves grossly intact. moves all 4 extremities w/o difficulty. Affect pleasant.  ECG: Atrial fibrillation with ventricular rate 95 bpm with right axis deviation and poor R wave progression and LVH associated with nonspecific ST-T changes.  EKG in May 2024 showed sinus rhythm.  Results for orders placed or performed during the hospital encounter of 09/26/23 (from the past 24 hours)  CBC with Differential     Status: Abnormal   Collection Time: 09/26/23  3:34 PM  Result Value Ref Range   WBC 11.3 (H) 4.0 - 10.5 K/uL   RBC 3.74 (L) 3.87 - 5.11 MIL/uL   Hemoglobin 11.2 (L) 12.0 - 15.0 g/dL   HCT 64.5 (L) 63.9 - 53.9 %   MCV 94.7 80.0 - 100.0 fL   MCH 29.9 26.0 - 34.0 pg   MCHC 31.6 30.0 - 36.0 g/dL   RDW 86.0 88.4 - 84.4 %   Platelets 210 150 - 400 K/uL   nRBC 0.0 0.0 - 0.2 %    Neutrophils Relative % 61 %   Neutro Abs 6.8 1.7 - 7.7 K/uL   Lymphocytes Relative 27 %   Lymphs Abs 3.1 0.7 - 4.0 K/uL   Monocytes Relative 9 %   Monocytes Absolute 1.0 0.1 - 1.0 K/uL   Eosinophils  Relative 2 %   Eosinophils Absolute 0.2 0.0 - 0.5 K/uL   Basophils Relative 0 %   Basophils Absolute 0.1 0.0 - 0.1 K/uL   Immature Granulocytes 1 %   Abs Immature Granulocytes 0.06 0.00 - 0.07 K/uL  Comprehensive metabolic panel     Status: Abnormal   Collection Time: 09/26/23  3:34 PM  Result Value Ref Range   Sodium 137 135 - 145 mmol/L   Potassium 4.6 3.5 - 5.1 mmol/L   Chloride 106 98 - 111 mmol/L   CO2 23 22 - 32 mmol/L   Glucose, Bld 154 (H) 70 - 99 mg/dL   BUN 42 (H) 8 - 23 mg/dL   Creatinine, Ser 9.33 0.44 - 1.00 mg/dL   Calcium  8.6 (L) 8.9 - 10.3 mg/dL   Total Protein 7.1 6.5 - 8.1 g/dL   Albumin 3.5 3.5 - 5.0 g/dL   AST 31 15 - 41 U/L   ALT 11 0 - 44 U/L   Alkaline Phosphatase 56 38 - 126 U/L   Total Bilirubin 0.7 0.0 - 1.2 mg/dL   GFR, Estimated >39 >39 mL/min   Anion gap 8 5 - 15  Troponin I (High Sensitivity)     Status: None   Collection Time: 09/26/23  3:34 PM  Result Value Ref Range   Troponin I (High Sensitivity) 11 <18 ng/L  Troponin I (High Sensitivity)     Status: None   Collection Time: 09/26/23  5:22 PM  Result Value Ref Range   Troponin I (High Sensitivity) 13 <18 ng/L  Urinalysis, Routine w reflex microscopic -Urine, Clean Catch     Status: Abnormal   Collection Time: 09/26/23 10:45 PM  Result Value Ref Range   Color, Urine STRAW (A) YELLOW   APPearance CLEAR (A) CLEAR   Specific Gravity, Urine 1.012 1.005 - 1.030   pH 6.0 5.0 - 8.0   Glucose, UA NEGATIVE NEGATIVE mg/dL   Hgb urine dipstick NEGATIVE NEGATIVE   Bilirubin Urine NEGATIVE NEGATIVE   Ketones, ur NEGATIVE NEGATIVE mg/dL   Protein, ur NEGATIVE NEGATIVE mg/dL   Nitrite NEGATIVE NEGATIVE   Leukocytes,Ua NEGATIVE NEGATIVE  Comprehensive metabolic panel     Status: Abnormal   Collection  Time: 09/27/23  4:50 AM  Result Value Ref Range   Sodium 140 135 - 145 mmol/L   Potassium 3.9 3.5 - 5.1 mmol/L   Chloride 109 98 - 111 mmol/L   CO2 26 22 - 32 mmol/L   Glucose, Bld 101 (H) 70 - 99 mg/dL   BUN 33 (H) 8 - 23 mg/dL   Creatinine, Ser 9.28 0.44 - 1.00 mg/dL   Calcium  8.3 (L) 8.9 - 10.3 mg/dL   Total Protein 5.6 (L) 6.5 - 8.1 g/dL   Albumin 2.8 (L) 3.5 - 5.0 g/dL   AST 21 15 - 41 U/L   ALT 14 0 - 44 U/L   Alkaline Phosphatase 45 38 - 126 U/L   Total Bilirubin 0.7 0.0 - 1.2 mg/dL   GFR, Estimated >39 >39 mL/min   Anion gap 5 5 - 15  CBC     Status: Abnormal   Collection Time: 09/27/23  4:50 AM  Result Value Ref Range   WBC 7.5 4.0 - 10.5 K/uL   RBC 2.96 (L) 3.87 - 5.11 MIL/uL   Hemoglobin 9.0 (L) 12.0 - 15.0 g/dL   HCT 72.4 (L) 63.9 - 53.9 %   MCV 92.9 80.0 - 100.0 fL   MCH 30.4 26.0 - 34.0 pg  MCHC 32.7 30.0 - 36.0 g/dL   RDW 86.0 88.4 - 84.4 %   Platelets 169 150 - 400 K/uL   nRBC 0.0 0.0 - 0.2 %  Magnesium     Status: None   Collection Time: 09/27/23  4:50 AM  Result Value Ref Range   Magnesium 1.8 1.7 - 2.4 mg/dL  Phosphorus     Status: None   Collection Time: 09/27/23  4:50 AM  Result Value Ref Range   Phosphorus 2.8 2.5 - 4.6 mg/dL   CT Cervical Spine Wo Contrast Result Date: 09/26/2023 EXAM: CT CERVICAL SPINE WITHOUT CONTRAST 09/26/2023 03:50:16 PM TECHNIQUE: CT of the cervical spine was performed without the administration of intravenous contrast. Multiplanar reformatted images are provided for review. Automated exposure control, iterative reconstruction, and/or weight based adjustment of the mA/kV was utilized to reduce the radiation dose to as low as reasonably achievable. COMPARISON: None available. CLINICAL HISTORY: Neck trauma (Age >= 65y). Head and neck trauma. Mechanical fall. FINDINGS: CERVICAL SPINE: BONES AND ALIGNMENT: No acute fracture or traumatic malalignment. DEGENERATIVE CHANGES: Chronic disc disease and uncovertebral spurring is present at  C5-6 and C6-7 bilaterally. Ankylosis is present across the disc space at C5-6 and C6-7. SOFT TISSUES: No prevertebral soft tissue swelling. IMPRESSION: 1. No acute abnormality of the cervical spine related to the reported neck trauma. 2. Chronic disc disease and uncovertebral spurring at C5-6 and C6-7 bilaterally with ankylosis across the disc space. Electronically signed by: Lonni Necessary MD 09/26/2023 04:18 PM EDT RP Workstation: [id number]   CT HEAD WO CONTRAST ( ) Result Date: 09/26/2023 EXAM: CT HEAD WITHOUT CONTRAST 09/26/2023 03:50:16 PM TECHNIQUE: CT of the head was performed without the administration of intravenous contrast. Automated exposure control, iterative reconstruction, and/or weight based adjustment of the mA/kV was utilized to reduce the radiation dose to as low as reasonably achievable. COMPARISON: None available. CLINICAL HISTORY: Head trauma, intracranial arterial injury suspected. Head and neck trauma. FINDINGS: BRAIN AND VENTRICLES: No acute hemorrhage. No evidence of acute infarct. No hydrocephalus. No extra-axial collection. No mass effect or midline shift. Mild atrophy and white matter changes are stable. ORBITS: No acute abnormality. SINUSES: No acute abnormality. SOFT TISSUES AND SKULL: A left paramedian anterior frontal scalp hematoma is present. No underlying fracture or foreign body is present. IMPRESSION: 1. No acute intracranial abnormality. 2. Left paramedian anterior frontal scalp hematoma without underlying fracture or foreign body. Electronically signed by: Lonni Necessary MD 09/26/2023 04:16 PM EDT RP Workstation: [id number]     ASSESSMENT AND PLAN: #1 new onset atrial fibrillation with controlled ventricular rate.  Patient has a hematoma from the fall on her anterior frontal scalp.  Advise starting Eliquis in few days.  Patient is asymptomatic, at this time from atrial fibrillation without any antiarrhythmic or rate control medications.   #2 syncope,  etiology of syncope is unclear since atrial fibrillation seems to be rate controlled.  Echocardiogram is still pending.  Her hemoglobin was 9.0, advise evaluation of anemia.  Also will get outpatient Holter monitor.  Do not see any pauses on monitor.  Thank you very much for referral.  Denyse Bathe

## 2023-09-27 NOTE — Plan of Care (Signed)

## 2023-09-29 ENCOUNTER — Emergency Department: Payer: PRIVATE HEALTH INSURANCE

## 2023-09-29 ENCOUNTER — Other Ambulatory Visit: Payer: Self-pay

## 2023-09-29 ENCOUNTER — Inpatient Hospital Stay
Admission: EM | Admit: 2023-09-29 | Discharge: 2023-10-01 | DRG: 357 | Disposition: A | Discharge status: Home Health | Payer: PRIVATE HEALTH INSURANCE | Attending: Internal Medicine | Admitting: Internal Medicine

## 2023-09-29 DIAGNOSIS — Z888 Allergy status to other drugs, medicaments and biological substances status: Secondary | ICD-10-CM

## 2023-09-29 DIAGNOSIS — Z23 Encounter for immunization: Secondary | ICD-10-CM

## 2023-09-29 DIAGNOSIS — Z7982 Long term (current) use of aspirin: Secondary | ICD-10-CM

## 2023-09-29 DIAGNOSIS — Z79899 Other long term (current) drug therapy: Secondary | ICD-10-CM | POA: Diagnosis not present

## 2023-09-29 DIAGNOSIS — S0003XA Contusion of scalp, initial encounter: Secondary | ICD-10-CM | POA: Diagnosis present

## 2023-09-29 DIAGNOSIS — I10 Essential (primary) hypertension: Secondary | ICD-10-CM | POA: Diagnosis present

## 2023-09-29 DIAGNOSIS — Z8042 Family history of malignant neoplasm of prostate: Secondary | ICD-10-CM | POA: Diagnosis not present

## 2023-09-29 DIAGNOSIS — K625 Hemorrhage of anus and rectum: Secondary | ICD-10-CM

## 2023-09-29 DIAGNOSIS — K922 Gastrointestinal hemorrhage, unspecified: Principal | ICD-10-CM | POA: Diagnosis present

## 2023-09-29 DIAGNOSIS — Z880 Allergy status to penicillin: Secondary | ICD-10-CM | POA: Diagnosis not present

## 2023-09-29 DIAGNOSIS — H409 Unspecified glaucoma: Secondary | ICD-10-CM | POA: Diagnosis present

## 2023-09-29 DIAGNOSIS — K5730 Diverticulosis of large intestine without perforation or abscess without bleeding: Secondary | ICD-10-CM | POA: Diagnosis present

## 2023-09-29 DIAGNOSIS — M4602 Spinal enthesopathy, cervical region: Secondary | ICD-10-CM | POA: Diagnosis present

## 2023-09-29 DIAGNOSIS — K551 Chronic vascular disorders of intestine: Secondary | ICD-10-CM | POA: Diagnosis present

## 2023-09-29 DIAGNOSIS — D62 Acute posthemorrhagic anemia: Secondary | ICD-10-CM | POA: Diagnosis present

## 2023-09-29 DIAGNOSIS — R54 Age-related physical debility: Secondary | ICD-10-CM | POA: Diagnosis present

## 2023-09-29 DIAGNOSIS — W19XXXA Unspecified fall, initial encounter: Secondary | ICD-10-CM | POA: Diagnosis present

## 2023-09-29 DIAGNOSIS — Y92009 Unspecified place in unspecified non-institutional (private) residence as the place of occurrence of the external cause: Secondary | ICD-10-CM

## 2023-09-29 DIAGNOSIS — R55 Syncope and collapse: Secondary | ICD-10-CM | POA: Diagnosis present

## 2023-09-29 DIAGNOSIS — Z90710 Acquired absence of both cervix and uterus: Secondary | ICD-10-CM

## 2023-09-29 DIAGNOSIS — E860 Dehydration: Secondary | ICD-10-CM | POA: Diagnosis present

## 2023-09-29 DIAGNOSIS — E782 Mixed hyperlipidemia: Secondary | ICD-10-CM | POA: Diagnosis present

## 2023-09-29 DIAGNOSIS — I480 Paroxysmal atrial fibrillation: Secondary | ICD-10-CM | POA: Diagnosis present

## 2023-09-29 LAB — CBC WITH DIFFERENTIAL/PLATELET
Abs Immature Granulocytes: 0.05 K/uL (ref 0.00–0.07)
Basophils Absolute: 0 K/uL (ref 0.0–0.1)
Basophils Relative: 0 %
Eosinophils Absolute: 0.1 K/uL (ref 0.0–0.5)
Eosinophils Relative: 1 %
HCT: 27 % — ABNORMAL LOW (ref 36.0–46.0)
Hemoglobin: 8.5 g/dL — ABNORMAL LOW (ref 12.0–15.0)
Immature Granulocytes: 1 %
Lymphocytes Relative: 29 %
Lymphs Abs: 2.8 K/uL (ref 0.7–4.0)
MCH: 30.1 pg (ref 26.0–34.0)
MCHC: 31.5 g/dL (ref 30.0–36.0)
MCV: 95.7 fL (ref 80.0–100.0)
Monocytes Absolute: 0.8 K/uL (ref 0.1–1.0)
Monocytes Relative: 8 %
Neutro Abs: 6.1 K/uL (ref 1.7–7.7)
Neutrophils Relative %: 61 %
Platelets: 222 K/uL (ref 150–400)
RBC: 2.82 MIL/uL — ABNORMAL LOW (ref 3.87–5.11)
RDW: 14.5 % (ref 11.5–15.5)
WBC: 9.9 K/uL (ref 4.0–10.5)
nRBC: 0 % (ref 0.0–0.2)

## 2023-09-29 LAB — PROTIME-INR
INR: 1 (ref 0.8–1.2)
Prothrombin Time: 14 s (ref 11.4–15.2)

## 2023-09-29 LAB — CBC
HCT: 21.9 % — ABNORMAL LOW (ref 36.0–46.0)
Hemoglobin: 7 g/dL — ABNORMAL LOW (ref 12.0–15.0)
MCH: 30.7 pg (ref 26.0–34.0)
MCHC: 32 g/dL (ref 30.0–36.0)
MCV: 96.1 fL (ref 80.0–100.0)
Platelets: 188 K/uL (ref 150–400)
RBC: 2.28 MIL/uL — ABNORMAL LOW (ref 3.87–5.11)
RDW: 14.5 % (ref 11.5–15.5)
WBC: 7.8 K/uL (ref 4.0–10.5)
nRBC: 0 % (ref 0.0–0.2)

## 2023-09-29 LAB — COMPREHENSIVE METABOLIC PANEL WITH GFR
ALT: 14 U/L (ref 0–44)
AST: 31 U/L (ref 15–41)
Albumin: 3.4 g/dL — ABNORMAL LOW (ref 3.5–5.0)
Alkaline Phosphatase: 52 U/L (ref 38–126)
Anion gap: 8 (ref 5–15)
BUN: 24 mg/dL — ABNORMAL HIGH (ref 8–23)
CO2: 21 mmol/L — ABNORMAL LOW (ref 22–32)
Calcium: 8.7 mg/dL — ABNORMAL LOW (ref 8.9–10.3)
Chloride: 108 mmol/L (ref 98–111)
Creatinine, Ser: 0.72 mg/dL (ref 0.44–1.00)
GFR, Estimated: >60 mL/min (ref >60)
Glucose, Bld: 150 mg/dL — ABNORMAL HIGH (ref 70–99)
Potassium: 4.2 mmol/L (ref 3.5–5.1)
Sodium: 137 mmol/L (ref 135–145)
Total Bilirubin: 0.6 mg/dL (ref 0.0–1.2)
Total Protein: 6.8 g/dL (ref 6.5–8.1)

## 2023-09-29 LAB — APTT: aPTT: 28 s (ref 24–36)

## 2023-09-29 MED ORDER — IOHEXOL 350 MG/ML SOLN
100.0000 mL | Freq: Once | INTRAVENOUS | Status: AC | PRN
  Administered 2023-09-29: 100 mL via INTRAVENOUS

## 2023-09-29 MED ORDER — DEXTROSE IN LACTATED RINGERS 5 % IV SOLN: INTRAVENOUS | Status: AC

## 2023-09-29 MED ORDER — ONDANSETRON HCL 4 MG PO TABS: 4.0000 mg | ORAL_TABLET | Freq: Four times a day (QID) | ORAL | Status: DC | PRN

## 2023-09-29 MED ORDER — PANTOPRAZOLE SODIUM 40 MG IV SOLR
40.0000 mg | INTRAVENOUS | Status: AC
  Administered 2023-09-29 (×2): 40 mg via INTRAVENOUS
  Filled 2023-09-29 (×2): qty 10

## 2023-09-29 MED ORDER — PANTOPRAZOLE SODIUM 40 MG IV SOLR
40.0000 mg | Freq: Two times a day (BID) | INTRAVENOUS | Status: DC
  Administered 2023-09-30 – 2023-10-01 (×2): 40 mg via INTRAVENOUS
  Filled 2023-09-29 (×2): qty 10

## 2023-09-29 MED ORDER — ONDANSETRON HCL 4 MG/2ML IJ SOLN: 4.0000 mg | Freq: Four times a day (QID) | INTRAMUSCULAR | Status: DC | PRN

## 2023-09-29 NOTE — ED Provider Notes (Signed)
 [facility] Provider Note    Event Date/Time   First MD Initiated Contact with Patient 09/29/23 1831     (approximate)   History   Rectal Bleeding   HPI  Stacey Lee is a 89 y.o. female who presents to the ED for evaluation of Rectal Bleeding   I review a medical DC summary from 2 days ago, patient was observed overnight for syncope.  History of HTN, HLD,  New onset A-fib during this recent admission, with anticoagulation being held for a few days, due to scalp hematoma. I review a 2016 colonoscopy, diverticulosis and otherwise normal exam.  Patient presents with son and daughter for evaluation of rectal bleeding in the past couple days.  They show me picture on the phone of a formed stool surrounded by red bloody splatter within the toilet bowl and stained water.  No other bleeding diatheses, no abdominal pain or rectal pain.   Physical Exam   Triage Vital Signs: ED Triage Vitals [09/29/23 1345]  Encounter Vitals Group     BP 129/84     Girls Systolic BP Percentile      Girls Diastolic BP Percentile      Boys Systolic BP Percentile      Boys Diastolic BP Percentile      Pulse Rate 71     Resp 18     Temp 97.7 F (36.5 C)     Temp Source Oral     SpO2 100 %     Weight 108 lb 0.4 oz (49 kg)     Height 5' (1.524 m)     Head Circumference      Peak Flow      Pain Score 0     Pain Loc      Pain Education      Exclude from Growth Chart     Most recent vital signs: Vitals:   09/29/23 1931 09/29/23 2230  BP:  129/78  Pulse:  92  Resp:    Temp: 97.9 F (36.6 C)   SpO2:  100%    General: Awake, no distress.  Generally well-appearing CV:  Good peripheral perfusion.  Resp:  Normal effort.  Abd:  No distention.  Minimal RLQ tenderness but otherwise benign abdomen. MSK:  No deformity noted.  Neuro:  No focal deficits appreciated. Other:     ED Results / Procedures / Treatments   Labs (all labs ordered are listed, but only abnormal  results are displayed) Labs Reviewed  CBC WITH DIFFERENTIAL/PLATELET - Abnormal; Notable for the following components:      Result Value   RBC 2.82 (*)    Hemoglobin 8.5 (*)    HCT 27.0 (*)    All other components within normal limits  COMPREHENSIVE METABOLIC PANEL WITH GFR - Abnormal; Notable for the following components:   CO2 21 (*)    Glucose, Bld 150 (*)    BUN 24 (*)    Calcium  8.7 (*)    Albumin 3.4 (*)    All other components within normal limits  PROTIME-INR  APTT  CBC  CBC  CBC  COMPREHENSIVE METABOLIC PANEL WITH GFR  TYPE AND SCREEN    EKG   RADIOLOGY CTA GI study with active extravasation around the hepatic flexure of the ascending colon  Official radiology report(s): CT ANGIO GI BLEED Result Date: 09/29/2023 CLINICAL DATA:  Painless lower GI bleed. History of diverticular bleed. EXAM: CTA ABDOMEN AND PELVIS WITHOUT AND WITH CONTRAST TECHNIQUE: Multidetector  CT imaging of the abdomen and pelvis was performed using the standard protocol during bolus administration of intravenous contrast. Multiplanar reconstructed images and MIPs were obtained and reviewed to evaluate the vascular anatomy. RADIATION DOSE REDUCTION: This exam was performed according to the departmental dose-optimization program which includes automated exposure control, adjustment of the mA and/or kV according to patient size and/or use of iterative reconstruction technique. CONTRAST:  OMNIPAQUE  IOHEXOL  350 MG/ML SOLN COMPARISON:  CT abdomen pelvis dated 09/17/2017. FINDINGS: Evaluation is limited due to streak artifact caused by patient's arms. VASCULAR Aorta: Mild atherosclerotic calcification. No aneurysmal dilatation or dissection. No periaortic fluid collection. Celiac: There is atherosclerotic calcification of the origin of the celiac trunk with luminal narrowing. The celiac artery and its major branches remain patent. SMA: The SMA is patent. Renals: The renal arteries are patent. Small accessory  right renal arteries noted. IMA: The IMA is patent. Inflow: Mild atherosclerotic calcification. No aneurysmal dilatation or dissection. The iliac arteries are patent. Proximal Outflow: The visualized proximal on fluids patent. Veins: The IVC is unremarkable. The SMV, splenic vein, and main portal vein are patent. No portal venous gas. Review of the MIP images confirms the above findings. NON-VASCULAR Lower chest: An area of atelectasis/scarring in the right middle lobe. Calcification of the mitral annulus. No intra-abdominal free air or free fluid. Hepatobiliary: The liver is unremarkable. Small gallstone. No pericholecystic fluid or evidence of acute cholecystitis by CT. Pancreas: Unremarkable. No pancreatic ductal dilatation or surrounding inflammatory changes. Spleen: Normal in size without focal abnormality. Adrenals/Urinary Tract: The adrenal glands are unremarkable. There is no hydronephrosis or nephrolithiasis on either side. The visualized ureters and urinary bladder appear unremarkable. Stomach/Bowel: There is severe sigmoid diverticulosis. Contrast pooling noted in the distal ascending colon in the right upper abdomen and close to the hepatic flexure (coronal series 14 images 66/75 and series 21, image 62). Underlying mass/malignancy as the cause of bleed is not excluded. There is no bowel obstruction or active inflammation. The appendix is normal. Lymphatic: No adenopathy. Reproductive: Hysterectomy.  No suspicious adnexal masses. Other: None Musculoskeletal: Osteopenia with degenerative changes of the spine. No acute osseous pathology. IMPRESSION: 1. Positive for active GI bleed in the distal ascending colon close to the hepatic flexure. 2. Severe sigmoid diverticulosis. No bowel obstruction. Normal appendix. 3. Cholelithiasis. 4.  Aortic Atherosclerosis (ICD10-I70.0). Electronically Signed   By: Vanetta Chou M.D.   On: 09/29/2023 20:55    PROCEDURES and INTERVENTIONS:  Procedures  Medications   dextrose  5 % in lactated ringers  infusion (has no administration in time range)  ondansetron  (ZOFRAN ) tablet 4 mg (has no administration in time range)    Or  ondansetron  (ZOFRAN ) injection 4 mg (has no administration in time range)  pantoprazole  (PROTONIX ) injection 40 mg (40 mg Intravenous Given 09/29/23 2226)    Followed by  pantoprazole  (PROTONIX ) injection 40 mg (has no administration in time range)  iohexol  (OMNIPAQUE ) 350 MG/ML injection 100 mL (100 mLs Intravenous Contrast Given 09/29/23 1944)     IMPRESSION / MDM / ASSESSMENT AND PLAN / ED COURSE  I reviewed the triage vital signs and the nursing notes.  Differential diagnosis includes, but is not limited to, diverticular bleeding, internal hemorrhoids, diverticulitis, brisk upper GI bleed  {Patient presents with symptoms of an acute illness or injury that is potentially life-threatening.  Patient presents to the ED with a couple days of painless rectal bleeding.  Looks well on exam, some very mild RLQ tenderness on exam but otherwise normal.  Hemodynamically stable.  Medical and slowly downtrending from recent values, renal function intact, normal coagulation panel.  Will obtain a CTA GI study and plan for admission.   CTA with signs of active extravasation.  I consult with both GI and vascular surgery, plan for endovascular treatment and possible embolization tomorrow with vascular.  Consult medicine for admission.  She remained stable without any active bleeding  Clinical Course as of 09/29/23 2241  Wed Sep 29, 2023  2143 I discuss with GI, he defers to Vascular [DS]    Clinical Course User Index [DS] Claudene Rover, MD     FINAL CLINICAL IMPRESSION(S) / ED DIAGNOSES   Final diagnoses:  Lower GI bleed     Rx / DC Orders   ED Discharge Orders     None        Note:  This document was prepared using Dragon voice recognition software and may include unintentional dictation errors.   Claudene Rover, MD 09/29/23  [phone]

## 2023-09-29 NOTE — ED Notes (Signed)
 Ice applied to R arm IV infiltration

## 2023-09-29 NOTE — H&P (Signed)
 History and Physical    Patient: Stacey Lee FMW:[id number] DOB: 01/04/1931 DOA: 09/29/2023 DOS: the patient was seen and examined on 09/29/2023 PCP: Sadie Manna, MD  Patient coming from: Home  Chief Complaint:  Chief Complaint  Patient presents with   Rectal Bleeding   HPI: Stacey Lee is a 89 y.o. female with medical history significant of syncopal episode, essential hypertension, hyperlipidemia, paroxysmal atrial fibrillation not on anticoagulation, hyperlipidemia, who was brought in from home with rectal bleed.  Patient was just discharged from the hospital 3 days ago after admission with fall and syncopal episode.  At the time she was found to have atrial fibrillation with rate control.  Her echocardiogram and carotid Dopplers that was normal.  She did have a hematoma on her anterior frontal scalp.  Due to scalp hematoma she was continued only on aspirin but no anticoagulation.  Today however she was found to have bright red blood per rectum.  Hemoglobin is 8.5 down from 9.0 2 days ago.  CT angiogram performed showed slow extravasation of blood into the colon consistent with active bleed.  Vascular surgery Dr. Marea was consulted.  Recommend admission and continued perform intervention in the morning unless patient has status changes.  Patient will be admitted therefore with rectal bleed most likely due to vascular reasons.  Review of Systems: As mentioned in the history of present illness. All other systems reviewed and are negative. Past Medical History:  Diagnosis Date   Glaucoma    Hyperlipidemia    Hypertension    Past Surgical History:  Procedure Laterality Date   ABDOMINAL HYSTERECTOMY     BREAST BIOPSY Right 1954   negative   COLONOSCOPY N/A 06/14/2014   Procedure: COLONOSCOPY;  Surgeon: Deward CINDERELLA Piedmont, MD;  Location: [facility];  Service: Gastroenterology;  Laterality: N/A;   EYE SURGERY     Social History:  reports that she has never smoked. She has never used smokeless  tobacco. She reports that she does not currently use alcohol. She reports that she does not use drugs.  Allergies  Allergen Reactions   Acetanilide Shortness Of Breath and Nausea Only   Acetazolamide Nausea Only and Other (See Comments)    Other Reaction: SOB, dizzy (po) Other Reaction: SOB, dizzy (po)    Penicillins Swelling    Family History  Problem Relation Age of Onset   Prostate cancer Father    Tuberculosis Maternal Grandfather    Breast cancer Neg Hx    Kidney cancer Neg Hx    Bladder Cancer Neg Hx     Prior to Admission medications   Medication Sig Start Date End Date Taking? Authorizing Provider  amLODipine  (NORVASC ) 5 MG tablet Take 5 mg by mouth daily. 08/24/23  Yes [provider]  hydrocortisone 2.5 % cream Apply topically 3 (three) times daily. 09/28/23 10/08/23 Yes [provider]  lisinopril  (ZESTRIL ) 30 MG tablet Take 30 mg by mouth daily. 09/13/23  Yes [provider]  amLODipine  (NORVASC ) 2.5 MG tablet TAKE 1 TABLET BY MOUTH ONCE DAILY 04/12/17   [provider]  Calcium  Carb-Cholecalciferol  (CALCIUM  600 + D PO) Take 1 tablet by mouth 2 (two) times daily.    [provider]  Coenzyme Q10 (CO Q 10) 100 MG CAPS Take 1 tablet by mouth daily.    [provider]  ibuprofen (ADVIL,MOTRIN) 200 MG tablet Take 400 mg by mouth every 6 (six) hours as needed for headache or moderate pain.    [provider]  lovastatin (MEVACOR) 20 MG tablet Take 20 mg by mouth 3 (three) times a week. Monday, Wednesday, and Friday    [provider]  Multiple Vitamin (MULTIVITAMIN WITH MINERALS) TABS tablet Take 1 tablet by mouth daily.    [provider]  nitroGLYCERIN  (NITROSTAT ) 0.4 MG SL tablet Place 1 tablet (0.4 mg total) under the tongue every 5 (five) minutes as needed (hypertension). Please only use for symptoms of lightheadedness AND blood pressure above 150 on top AND above 100 on the bottom 06/01/22 06/01/23   Bradler, Evan K, MD  Omega-3 Fatty Acids (FISH OIL) 1000 MG CAPS Take 1 capsule by mouth 2 (two) times daily.    [provider]  prednisoLONE  acetate (PRED FORTE ) 1 % ophthalmic suspension Place 1 drop into the left eye daily.    [provider]  Red Yeast Rice 600 MG CAPS Take 1 capsule by mouth 2 (two) times daily.    [provider]  RESTASIS 0.05 % ophthalmic emulsion INSTILL 1 DROP INTO EACH EYE TWICE DAILY 08/01/17   [provider]    Physical Exam: Vitals:   09/29/23 1345 09/29/23 1930 09/29/23 1931  BP: 129/84 132/67   Pulse: 71 71   Resp: 18 18   Temp: 97.7 F (36.5 C)  97.9 F (36.6 C)  TempSrc: Oral  Oral  SpO2: 100% 100%   Weight: 49 kg    Height: 5' (1.524 m)     Constitutional: Chronically ill looking and weak, NAD, calm, comfortable Eyes: PERRL, lids and conjunctivae pale ENMT: Mucous membranes are moist. Posterior pharynx clear of any exudate or lesions.Normal dentition.  Neck: normal, supple, no masses, no thyromegaly Respiratory: clear to auscultation bilaterally, no wheezing, no crackles. Normal respiratory effort. No accessory muscle use.  Cardiovascular: Regular rate and rhythm, no murmurs / rubs / gallops. No extremity edema. 2+ pedal pulses. No carotid bruits.  Abdomen: no tenderness, no masses palpated. No hepatosplenomegaly. Bowel sounds positive.  Musculoskeletal: Good range of motion, no joint swelling or tenderness, Skin: no rashes, lesions, ulcers. No induration Neurologic: CN 2-12 grossly intact. Sensation intact, DTR normal. Strength 5/5 in all 4.  Psychiatric: Normal judgment and insight. Alert and oriented x 3. Normal mood  Data Reviewed:  Vitals entirely stable, glucose is 101 BUN 33.  Albumin 2.8.  Hemoglobin 8.5.  Normal white count.  INR 1.0.  Glucose is 150.  CT angio GI bleed shows positive for active GI bleed in the distal ascending colon close to the hepatic flexure.  There is severe sigmoid diverticulosis  no bowel obstruction normal appendix and cholelithiasis.  Assessment and Plan:  #1 active lower GI bleed: CTA indicated distal ascending colon lesion near the hepatic flexure.  Patient will be admitted to monitored bed.  Initiate IV Protonix .  Serial H&H.  Type and crossmatch and hold.  Transfuse if hemoglobin drops below 8 g.  Vascular surgery consulted.  Patient will be n.p.o. and plan for embolization in the morning.  If condition worsens Dr. Marea is available for intervention tonight.  #2 anemia of acute blood loss: Will check CBC every 6 hours.  If hemoglobin drops we will transfuse.  #3 paroxysmal atrial fibrillation: Patient not on anticoagulation.  Continue rate control.  IV beta-blocker as needed.  #4 essential hypertension: Blood pressure appears controlled.  Again will use beta-blockers as needed  #5 mixed hyperlipidemia: Patient will be NPO.  Hold all medications for now.    Advance Care Planning:   Code Status: Prior full  code  Consults: Dr. Marea, vascular surgery  Family Communication: Daughter is at bedside  Severity of Illness: The appropriate patient status for this patient is INPATIENT. Inpatient status is judged to be reasonable and necessary in order to provide the required intensity of service to ensure the patient's safety. The patient's presenting symptoms, physical exam findings, and initial radiographic and laboratory data in the context of their chronic comorbidities is felt to place them at high risk for further clinical deterioration. Furthermore, it is not anticipated that the patient will be medically stable for discharge from the hospital within 2 midnights of admission.   * I certify that at the point of admission it is my clinical judgment that the patient will require inpatient hospital care spanning beyond 2 midnights from the point of admission due to high intensity of service, high risk for further deterioration and high frequency of surveillance  required.*  AuthorBETHA SIM KNOLL, MD 09/29/2023 10:03 PM  For on call review [email or web address].

## 2023-09-29 NOTE — ED Triage Notes (Signed)
 Pt sent by pcp for rectal bleeding and colonoscopy. Pt denies pain. Pt hgb yesterday was 9,5, improved from 9 on 1 Sept. Pt had 2 episodes of bloody stool yesterday. Pt not on blood thinners, discontinued baby aspirin a few days ago.

## 2023-09-30 ENCOUNTER — Encounter
Admission: EM | Disposition: A | Discharge status: Home / Self Care | Payer: Self-pay | Source: Home / Self Care | Attending: Internal Medicine

## 2023-09-30 DIAGNOSIS — I480 Paroxysmal atrial fibrillation: Secondary | ICD-10-CM | POA: Diagnosis not present

## 2023-09-30 DIAGNOSIS — D62 Acute posthemorrhagic anemia: Secondary | ICD-10-CM

## 2023-09-30 DIAGNOSIS — E782 Mixed hyperlipidemia: Secondary | ICD-10-CM | POA: Diagnosis not present

## 2023-09-30 DIAGNOSIS — K922 Gastrointestinal hemorrhage, unspecified: Secondary | ICD-10-CM

## 2023-09-30 DIAGNOSIS — I10 Essential (primary) hypertension: Secondary | ICD-10-CM | POA: Diagnosis not present

## 2023-09-30 HISTORY — PX: VISCERAL ANGIOGRAPHY: CATH118276

## 2023-09-30 LAB — CBC
HCT: 22 % — ABNORMAL LOW (ref 36.0–46.0)
HCT: 29 % — ABNORMAL LOW (ref 36.0–46.0)
Hemoglobin: 6.7 g/dL — ABNORMAL LOW (ref 12.0–15.0)
Hemoglobin: 9.9 g/dL — ABNORMAL LOW (ref 12.0–15.0)
MCH: 30.5 pg (ref 26.0–34.0)
MCH: 30.3 pg (ref 26.0–34.0)
MCHC: 30.5 g/dL (ref 30.0–36.0)
MCHC: 34.1 g/dL (ref 30.0–36.0)
MCV: 100 fL (ref 80.0–100.0)
MCV: 88.7 fL (ref 80.0–100.0)
Platelets: 184 K/uL (ref 150–400)
Platelets: 178 K/uL (ref 150–400)
RBC: 2.2 MIL/uL — ABNORMAL LOW (ref 3.87–5.11)
RBC: 3.27 MIL/uL — ABNORMAL LOW (ref 3.87–5.11)
RDW: 14.8 % (ref 11.5–15.5)
RDW: 15.8 % — ABNORMAL HIGH (ref 11.5–15.5)
WBC: 7.6 K/uL (ref 4.0–10.5)
WBC: 10.2 K/uL (ref 4.0–10.5)
nRBC: 0 % (ref 0.0–0.2)
nRBC: 0 % (ref 0.0–0.2)

## 2023-09-30 LAB — COMPREHENSIVE METABOLIC PANEL WITH GFR
ALT: 14 U/L (ref 0–44)
AST: 24 U/L (ref 15–41)
Albumin: 2.7 g/dL — ABNORMAL LOW (ref 3.5–5.0)
Alkaline Phosphatase: 41 U/L (ref 38–126)
Anion gap: 7 (ref 5–15)
BUN: 20 mg/dL (ref 8–23)
CO2: 24 mmol/L (ref 22–32)
Calcium: 8 mg/dL — ABNORMAL LOW (ref 8.9–10.3)
Chloride: 107 mmol/L (ref 98–111)
Creatinine, Ser: 0.69 mg/dL (ref 0.44–1.00)
GFR, Estimated: >60 mL/min (ref >60)
Glucose, Bld: 148 mg/dL — ABNORMAL HIGH (ref 70–99)
Potassium: 3.7 mmol/L (ref 3.5–5.1)
Sodium: 138 mmol/L (ref 135–145)
Total Bilirubin: 0.6 mg/dL (ref 0.0–1.2)
Total Protein: 5.5 g/dL — ABNORMAL LOW (ref 6.5–8.1)

## 2023-09-30 LAB — PREPARE RBC (CROSSMATCH)

## 2023-09-30 LAB — ABO/RH: ABO/RH(D): O POS

## 2023-09-30 SURGERY — VISCERAL ANGIOGRAPHY
Anesthesia: Moderate Sedation

## 2023-09-30 MED ORDER — FENTANYL CITRATE (PF) 100 MCG/2ML IJ SOLN
INTRAMUSCULAR | Status: DC | PRN
  Administered 2023-09-30: 50 ug via INTRAVENOUS

## 2023-09-30 MED ORDER — PREDNISOLONE ACETATE 1 % OP SUSP
1.0000 [drp] | Freq: Every day | OPHTHALMIC | Status: DC
  Administered 2023-09-30 – 2023-10-01 (×2): 1 [drp] via OPHTHALMIC
  Filled 2023-09-30: qty 1

## 2023-09-30 MED ORDER — VANCOMYCIN HCL IN DEXTROSE 1-5 GM/200ML-% IV SOLN
1000.0000 mg | INTRAVENOUS | Status: DC
  Administered 2023-09-30: 1000 mg via INTRAVENOUS
  Filled 2023-09-30: qty 200

## 2023-09-30 MED ORDER — SODIUM CHLORIDE 0.9 % IV SOLN: INTRAVENOUS | Status: DC

## 2023-09-30 MED ORDER — DIPHENHYDRAMINE HCL 50 MG/ML IJ SOLN: 50.0000 mg | Freq: Once | INTRAMUSCULAR | Status: DC | PRN

## 2023-09-30 MED ORDER — PRAVASTATIN SODIUM 20 MG PO TABS
20.0000 mg | ORAL_TABLET | Freq: Every day | ORAL | Status: DC
  Administered 2023-09-30: 20 mg via ORAL
  Filled 2023-09-30: qty 1

## 2023-09-30 MED ORDER — MIDAZOLAM HCL 2 MG/2ML IJ SOLN
INTRAMUSCULAR | Status: AC
Start: 2023-09-30 — End: 2023-09-30
  Filled 2023-09-30: qty 2

## 2023-09-30 MED ORDER — MIDAZOLAM HCL 2 MG/2ML IJ SOLN
INTRAMUSCULAR | Status: DC | PRN
  Administered 2023-09-30: 1 mg via INTRAVENOUS

## 2023-09-30 MED ORDER — DORZOLAMIDE HCL 2 % OP SOLN
1.0000 [drp] | Freq: Two times a day (BID) | OPHTHALMIC | Status: DC
  Administered 2023-09-30 – 2023-10-01 (×2): 1 [drp] via OPHTHALMIC
  Filled 2023-09-30: qty 10

## 2023-09-30 MED ORDER — LIDOCAINE-EPINEPHRINE (PF) 1 %-1:200000 IJ SOLN
INTRAMUSCULAR | Status: DC | PRN
  Administered 2023-09-30: 10 mL

## 2023-09-30 MED ORDER — IODIXANOL 320 MG/ML IV SOLN
INTRAVENOUS | Status: DC | PRN
  Administered 2023-09-30: 30 mL

## 2023-09-30 MED ORDER — MIDAZOLAM HCL 2 MG/ML PO SYRP
8.0000 mg | ORAL_SOLUTION | Freq: Once | ORAL | Status: DC | PRN
  Filled 2023-09-30: qty 5

## 2023-09-30 MED ORDER — FENTANYL CITRATE (PF) 100 MCG/2ML IJ SOLN
INTRAMUSCULAR | Status: AC
  Filled 2023-09-30: qty 2

## 2023-09-30 MED ORDER — HEPARIN (PORCINE) IN NACL 1000-0.9 UT/500ML-% IV SOLN
INTRAVENOUS | Status: DC | PRN
  Administered 2023-09-30: 1000 mL

## 2023-09-30 MED ORDER — ADULT MULTIVITAMIN W/MINERALS CH
1.0000 | ORAL_TABLET | Freq: Every day | ORAL | Status: DC
  Administered 2023-09-30 – 2023-10-01 (×2): 1 via ORAL
  Filled 2023-09-30 (×2): qty 1

## 2023-09-30 MED ORDER — SODIUM CHLORIDE 0.9% IV SOLUTION
Freq: Once | INTRAVENOUS | Status: DC
  Filled 2023-09-30: qty 250

## 2023-09-30 MED ORDER — FAMOTIDINE 20 MG PO TABS: 40.0000 mg | ORAL_TABLET | Freq: Once | ORAL | Status: DC | PRN

## 2023-09-30 MED ORDER — METHYLPREDNISOLONE SODIUM SUCC 125 MG IJ SOLR: 125.0000 mg | Freq: Once | INTRAMUSCULAR | Status: DC | PRN

## 2023-09-30 SURGICAL SUPPLY — 13 items
CATH ANGIO 5F PIGTAIL 65CM (CATHETERS) IMPLANT
CATH MICROCATH PRGRT 2.8F 110 (CATHETERS) IMPLANT
CATH VS15FR (CATHETERS) IMPLANT
COVER PROBE ULTRASOUND 5X96 (MISCELLANEOUS) IMPLANT
DEVICE STARCLOSE SE CLOSURE (Vascular Products) IMPLANT
DEVICE TORQUE (MISCELLANEOUS) IMPLANT
GLIDEWIRE STIFF .35X180X3 HYDR (WIRE) IMPLANT
MICROSPHERE 600+75UM (Embolic) IMPLANT
PACK ANGIOGRAPHY (CUSTOM PROCEDURE TRAY) ×1 IMPLANT
SHEATH BRITE TIP 5FRX11 (SHEATH) IMPLANT
SYR MEDRAD MARK 7 150ML (SYRINGE) IMPLANT
TUBING CONTRAST HIGH PRESS 72 (TUBING) IMPLANT
WIRE J 3MM .035X145CM (WIRE) IMPLANT

## 2023-09-30 NOTE — Assessment & Plan Note (Signed)
 Anemia secondary to acute blood loss.  Secondary to lower GI bleed s/p embolization of hepatic flexure branch of SMA by vascular surgery. Received 2 unit of PRBC -Monitor hemoglobin -Transfuse if below 7

## 2023-09-30 NOTE — Op Note (Signed)
 [facility]  Percutaneous Study/Intervention Procedural Note     Surgeon(s): [employer]   Assistants: none  Pre-operative Diagnosis: 1. Lower GI bleed   Post-operative diagnosis:  Same  Procedure(s) Performed:             1.  Ultrasound guidance for vascular access right femoral artery             2.  Catheter placement into the hepatic flexure branch off of the right colonic branch of the SMA from right femoral approach             3.  Aortogram and selective angiogram of the SMA and the hepatic flexure branch             4.  Microbead embolization of hepatic flexure branch of the SMA with approximately 2 cc of 600  polyvinyl alcohol beads.             5.  StarClose closure device right femoral artery  Anesthesia: Moderate conscious sedation for approximately 28 minutes using 1 mg of Versed  and 50 mcg of Fentanyl               EBL: 5 cc  Fluoro Time: 3.3 minutes  Contrast: 30 cc              Indications:  Patient is a 89 y.o.female with brisk lower GI bleeding with resultant anemia. The patient has a CT angiogram suggesting active extravasation at the hepatic flexure. The patient is brought in for angiography for further evaluation and potential treatment. Risks and benefits are discussed and informed consent is obtained  Procedure:  The patient was identified and appropriate procedural time out was performed.  The patient was then placed supine on the table and prepped and draped in the usual sterile fashion. Moderate conscious sedation was administered during a face to face encounter with the patient throughout the procedure with my supervision of the RN administering medicines and monitoring the patient's vital signs, pulse oximetry, telemetry and mental status throughout from the start of the procedure until the patient was taken to the recovery room.  Ultrasound was used to evaluate the right common femoral artery.  It was patent .  A digital ultrasound  image was acquired.  A Seldinger needle was used to access the right common femoral artery under direct ultrasound guidance and a permanent image was performed.  A 0.035 J wire was advanced without resistance and a 5Fr sheath was placed.  Pigtail catheter was placed into the aorta and an AP aortogram was performed. This demonstrated normal aorta and iliac arteries without significant stenosis.  The renal arteries appeared patent.  I attempted to cannulate the SMA from an AP projection.  I first cannulated the left renal artery with a the S1 catheter and selective imaging showed this to be widely patent.  This gave us  a roadmap to try to cannulate the SMA, but ultimately we transitioned to a fairly steep RAO projection to cannulate the SMA. A V S1 catheter was used to selectively cannulate the SMA and imaging was performed.  This demonstrated significant stenosis of the SMA with a right colonic branch coming off of the main SMA and giving rise to a hepatic flexure branch that fed the area of extravasation. Based on her continued bleeding and the nuclear medicine study I elected to treat this area with embolization. I initially advanced the Pro-Great microcatheter out the SMA and into the right colonic branch.  After  imaging was performed I then advanced directly into the hepatic flexure branch and demonstrated active extravasation at this location.  I then proceeded with treatment and instilled approximately 1.5 cc of 600 m polyvinyl alcohol beads in this location. Angiogram following this showed the main vessels to be open with less brisk filling. I then pulled back before its bifurcation and the main right hepatic artery.  There were some collateral vessels feeding the hepatic flexure so I elected to place more polyvinyl alcohol beads in this location.  0.5 cc of 600 m polyvinyl alcohol beads were deployed in the right colonic branch. Again, completion angiogram showed the main vessels to be open with less brisk  filling. I elected to terminate the procedure. The diagnostic catheter was removed. StarClose closure device was deployed in usual fashion with excellent hemostatic result. The patient was taken to the recovery room in stable condition having tolerated the procedure well.    Disposition: Patient was taken to the recovery room in stable condition having tolerated the procedure well.  Complications:  None  Selinda Gu 09/30/2023 9:57 AM   This note was created with Dragon Medical transcription system. Any errors in dictation are purely unintentional.

## 2023-09-30 NOTE — ED Notes (Signed)
Report given to [facility]. 

## 2023-09-30 NOTE — Hospital Course (Addendum)
 Partly taken from H&P.  Stacey Lee is a 89 y.o. female with medical history significant of syncopal episode, essential hypertension, hyperlipidemia, paroxysmal atrial fibrillation not on anticoagulation, hyperlipidemia, who was brought in from home with rectal bleed.  Patient was just discharged from the hospital 3 days ago after admission with fall and syncopal episode.  At the time she was found to have atrial fibrillation with rate control.  Her echocardiogram and carotid Dopplers that was normal.  She did have a hematoma on her anterior frontal scalp.  Due to scalp hematoma she was continued only on aspirin but no anticoagulation.   On presentation stable vitals, hemoglobin with some decreased to 8.5, INR 1.0. CT angio GI bleed shows positive for active GI bleed in the distal ascending colon close to the hepatic flexure. There is severe sigmoid diverticulosis no bowel obstruction normal appendix and cholelithiasis.   Vascular surgery was consulted and they are planning to do visceral angiography and possible intervention.  9/4: Vital stable, hemoglobin with further decreased to 6.7, 2 units of PRBC was ordered, only 1 was given before procedure. S/p embolization of hepatic flexure branch of SMA by vascular surgery.  Tolerated the procedure well.  Received second unit after the procedure.  9/5: Hemodynamically stable, hemoglobin stable at 9.9, no more bleeding.  Vascular surgery cleared her for discharge with outpatient follow-up.  PT recommended home health which was ordered.  Patient was also started on iron supplement.  Patient was instructed to avoid NSAID to decrease the risk of bleeding.  She will continue the rest of her home medications and follow-up with her providers for further assistance.

## 2023-09-30 NOTE — Assessment & Plan Note (Signed)
 Currently in sinus rhythm. Not on any rate limiting or anticoagulation at home -Continue to monitor -As needed metoprolol

## 2023-09-30 NOTE — Progress Notes (Signed)
 Patient stable. Bedside handoff given to [facility]

## 2023-09-30 NOTE — Assessment & Plan Note (Signed)
-   Continue home statin

## 2023-09-30 NOTE — Consult Note (Signed)
 Hospital Consult    Reason for Consult:  GI Bleed Requesting Physician:  Dr Ester Sharps MD  MRN #:  [id number]  History of Present Illness: [name] who presents to the ED for evaluation of Rectal Bleeding   I review a medical DC summary from 2 days ago, patient was observed overnight for syncope.  History of HTN, HLD, New onset A-fib during this recent admission, with anticoagulation being held for a few days, due to scalp hematoma. Patient states she is to follow up this Friday with someone for a monitor device for her syncope. Patient returned to the emergency department yesterday with 2-3 days of bloody stools.   On work up patient underwent CTA of the Abdomen and pelvis revealing bleeding of the intestinal tract at the hepatic flexure. Vascular Surgery was consulted to evaluate.   Past Medical History:  Diagnosis Date   Glaucoma    Hyperlipidemia    Hypertension     Past Surgical History:  Procedure Laterality Date   ABDOMINAL HYSTERECTOMY     BREAST BIOPSY Right 1954   negative   COLONOSCOPY N/A 06/14/2014   Procedure: COLONOSCOPY;  Surgeon: Deward CINDERELLA Piedmont, MD;  Location: [facility] ENDOSCOPY;  Service: Gastroenterology;  Laterality: N/A;   EYE SURGERY      Allergies  Allergen Reactions   Acetanilide Shortness Of Breath and Nausea Only   Acetazolamide Nausea Only and Other (See Comments)    Other Reaction: SOB, dizzy (po) Other Reaction: SOB, dizzy (po)    Penicillins Swelling    Prior to Admission medications   Medication Sig Start Date End Date Taking? Authorizing Provider  amLODipine  (NORVASC ) 5 MG tablet Take 5 mg by mouth daily. 08/24/23  Yes [provider]  hydrocortisone 2.5 % cream Apply topically 3 (three) times daily. 09/28/23 10/08/23 Yes [provider]  lisinopril  (ZESTRIL ) 30 MG tablet Take 30 mg by mouth daily. 09/13/23  Yes [provider]  amLODipine  (NORVASC ) 2.5 MG tablet TAKE 1 TABLET BY MOUTH ONCE DAILY 04/12/17    [provider]  Calcium  Carb-Cholecalciferol  (CALCIUM  600 + D PO) Take 1 tablet by mouth 2 (two) times daily.    [provider]  Coenzyme Q10 (CO Q 10) 100 MG CAPS Take 1 tablet by mouth daily.    [provider]  ibuprofen (ADVIL,MOTRIN) 200 MG tablet Take 400 mg by mouth every 6 (six) hours as needed for headache or moderate pain.    [provider]  lovastatin (MEVACOR) 20 MG tablet Take 20 mg by mouth 3 (three) times a week. Monday, Wednesday, and Friday    [provider]  Multiple Vitamin (MULTIVITAMIN WITH MINERALS) TABS tablet Take 1 tablet by mouth daily.    [provider]  nitroGLYCERIN  (NITROSTAT ) 0.4 MG SL tablet Place 1 tablet (0.4 mg total) under the tongue every 5 (five) minutes as needed (hypertension). Please only use for symptoms of lightheadedness AND blood pressure above 150 on top AND above 100 on the bottom 06/01/22 06/01/23  Bradler, Evan K, MD  Omega-3 Fatty Acids (FISH OIL) 1000 MG CAPS Take 1 capsule by mouth 2 (two) times daily.    [provider]  prednisoLONE  acetate (PRED FORTE ) 1 % ophthalmic suspension Place 1 drop into the left eye daily.    [provider]  Red Yeast Rice 600 MG CAPS Take 1 capsule by mouth 2 (two) times daily.    [provider]  RESTASIS 0.05 % ophthalmic emulsion INSTILL  1 DROP INTO EACH EYE TWICE DAILY 08/01/17   [provider]    Social History   Socioeconomic History   Marital status: Married    Spouse name: Not on file   Number of children: Not on file   Years of education: Not on file   Highest education level: Not on file  Occupational History   Not on file  Tobacco Use   Smoking status: Never   Smokeless tobacco: Never  Vaping Use   Vaping status: Never Used  Substance and Sexual Activity   Alcohol use: Not Currently   Drug use: Never   Sexual activity: Not on file  Other Topics Concern   Not on file  Social History Narrative   Not on  file   Social Drivers of Health   Financial Resource Strain: Low Risk  (09/23/2023)   Received from [facility]   Overall Financial Resource Strain (CARDIA)    Difficulty of Paying Living Expenses: Not hard at all  Food Insecurity: No Food Insecurity (09/26/2023)   Hunger Vital Sign    Worried About Running Out of Food in the Last Year: Never true    Ran Out of Food in the Last Year: Never true  Transportation Needs: No Transportation Needs (09/26/2023)   PRAPARE - Administrator, Civil Service (Medical): No    Lack of Transportation (Non-Medical): No  Physical Activity: Not on file  Stress: Not on file  Social Connections: Not on file  Intimate Partner Violence: Not At Risk (09/26/2023)   Humiliation, Afraid, Rape, and Kick questionnaire    Fear of Current or Ex-Partner: No    Emotionally Abused: No    Physically Abused: No    Sexually Abused: No     Family History  Problem Relation Age of Onset   Prostate cancer Father    Tuberculosis Maternal Grandfather    Breast cancer Neg Hx    Kidney cancer Neg Hx    Bladder Cancer Neg Hx     ROS: Otherwise negative unless mentioned in HPI  Physical Examination  Vitals:   09/30/23 0500 09/30/23 0600  BP: (!) 121/59 (!) 116/52  Pulse:  84  Resp: 19 16  Temp: 98.4 F (36.9 C)   SpO2: 100% 99%   Body mass index is 21.1 kg/m.  General:  WDWN in NAD Gait: Not observed HENT: WNL, normocephalic Pulmonary: normal non-labored breathing, without Rales, rhonchi,  wheezing Cardiac: irregular, Hx New Onset Afib, without  Murmurs, rubs or gallops; without carotid bruits Abdomen: Positive bowel sounds throughout, soft, NT/ND, no masses Skin: without rashes Vascular Exam/Pulses: Palpable Pulses throughout all extremities are warm to touch.  Extremities: without ischemic changes, without Gangrene , without cellulitis; without open wounds;  Musculoskeletal: no muscle wasting or atrophy  Neurologic: A&O X 3;   No focal weakness or paresthesias are detected; speech is fluent/normal Psychiatric:  The pt has Normal affect. Lymph:  Unremarkable  CBC    Component Value Date/Time   WBC 7.6 09/30/2023 0510   RBC 2.20 (L) 09/30/2023 0510   HGB 6.7 (L) 09/30/2023 0510   HCT 22.0 (L) 09/30/2023 0510   PLT 184 09/30/2023 0510   MCV 100.0 09/30/2023 0510   MCH 30.5 09/30/2023 0510   MCHC 30.5 09/30/2023 0510   RDW 14.8 09/30/2023 0510   LYMPHSABS 2.8 09/29/2023 1348   MONOABS 0.8 09/29/2023 1348   EOSABS 0.1 09/29/2023 1348   BASOSABS 0.0 09/29/2023 1348    BMET  Component Value Date/Time   NA 138 09/30/2023 0510   K 3.7 09/30/2023 0510   CL 107 09/30/2023 0510   CO2 24 09/30/2023 0510   GLUCOSE 148 (H) 09/30/2023 0510   BUN 20 09/30/2023 0510   BUN 15 09/01/2017 0820   CREATININE 0.69 09/30/2023 0510   CALCIUM  8.0 (L) 09/30/2023 0510   GFRNONAA >60 09/30/2023 0510   GFRAA 79 09/01/2017 0820    COAGS: Lab Results  Component Value Date   INR 1.0 09/29/2023     Non-Invasive Vascular Imaging:   EXAM:09/29/23 CTA ABDOMEN AND PELVIS WITHOUT AND WITH CONTRAST   TECHNIQUE: Multidetector CT imaging of the abdomen and pelvis was performed using the standard protocol during bolus administration of intravenous contrast. Multiplanar reconstructed images and MIPs were obtained and reviewed to evaluate the vascular anatomy.   RADIATION DOSE REDUCTION: This exam was performed according to the departmental dose-optimization program which includes automated exposure control, adjustment of the mA and/or kV according to patient size and/or use of iterative reconstruction technique.   CONTRAST:  OMNIPAQUE  IOHEXOL  350 MG/ML SOLN   COMPARISON:  CT abdomen pelvis dated 09/17/2017.   FINDINGS: Evaluation is limited due to streak artifact caused by patient's arms.   VASCULAR   Aorta: Mild atherosclerotic calcification. No aneurysmal dilatation or dissection. No periaortic fluid  collection.   Celiac: There is atherosclerotic calcification of the origin of the celiac trunk with luminal narrowing. The celiac artery and its major branches remain patent.   SMA: The SMA is patent.   Renals: The renal arteries are patent. Small accessory right renal arteries noted.   IMA: The IMA is patent.   Inflow: Mild atherosclerotic calcification. No aneurysmal dilatation or dissection. The iliac arteries are patent.   Proximal Outflow: The visualized proximal on fluids patent.   Veins: The IVC is unremarkable. The SMV, splenic vein, and main portal vein are patent. No portal venous gas.   Review of the MIP images confirms the above findings.   NON-VASCULAR   Lower chest: An area of atelectasis/scarring in the right middle lobe. Calcification of the mitral annulus.   No intra-abdominal free air or free fluid.   Hepatobiliary: The liver is unremarkable. Small gallstone. No pericholecystic fluid or evidence of acute cholecystitis by CT.   Pancreas: Unremarkable. No pancreatic ductal dilatation or surrounding inflammatory changes.   Spleen: Normal in size without focal abnormality.   Adrenals/Urinary Tract: The adrenal glands are unremarkable. There is no hydronephrosis or nephrolithiasis on either side. The visualized ureters and urinary bladder appear unremarkable.   Stomach/Bowel: There is severe sigmoid diverticulosis. Contrast pooling noted in the distal ascending colon in the right upper abdomen and close to the hepatic flexure (coronal series 14 images 66/75 and series 21, image 62). Underlying mass/malignancy as the cause of bleed is not excluded. There is no bowel obstruction or active inflammation. The appendix is normal.   Lymphatic: No adenopathy.   Reproductive: Hysterectomy.  No suspicious adnexal masses.   Other: None   Musculoskeletal: Osteopenia with degenerative changes of the spine. No acute osseous pathology.   IMPRESSION: 1.  Positive for active GI bleed in the distal ascending colon close to the hepatic flexure. 2. Severe sigmoid diverticulosis. No bowel obstruction. Normal appendix. 3. Cholelithiasis. 4.  Aortic Atherosclerosis (ICD10-I70.0).  Statin:  No. Beta Blocker:  No. Aspirin:  No. ACEI:  Yes.   ARB:  No. CCB use:  Yes Other antiplatelets/anticoagulants:  No. Prior note states on anticoagulation but held for syncopal  episode.    ASSESSMENT/PLAN: [name] who presents to [facility] emergency department after 2 to 3 days of bloody stools.  Patient was admitted to the hospital 2 days prior for syncopal episode in which she was discharged home to follow-up with cardiology for Holter monitor.  Patient states she has had a syncopal episode a year ago and she was placed on Eliquis at that time but not really told why.  Patient underwent CTA of the abdomen pelvis which revealed an active GI bleed in the distal ascending colon close to the hepatic flexure.  Vascular surgery at this time recommends patient undergo a visceral angiogram with possible embolization.  I discussed in detail at bedside in the emergency review this morning with the patient and the family member of the procedure, benefits, risk, complications.  Both verbalized her understanding and wished to proceed.  I answered all her questions.  Patient endorses she has been n.p.o. since lunchtime yesterday.  Patient does have a history of oral anticoagulation but this was stopped 5 days ago after her syncopal episode.  Patient's current hemoglobin this morning is 6.7 and creatinine 22.  Patient's BUN 20 and creatinine 1.60   -I discussed the case in detail with Dr. Selinda Gu MD and he agrees with plan.   Stacey Lee [facility] 09/30/2023 7:12 AM

## 2023-09-30 NOTE — Plan of Care (Signed)

## 2023-09-30 NOTE — Interval H&P Note (Signed)
 History and Physical Interval Note:  09/30/2023 9:01 AM  Stacey Lee  has presented today for surgery, with the diagnosis of GI Bleed.  The various methods of treatment have been discussed with the patient and family. After consideration of risks, benefits and other options for treatment, the patient has consented to  Procedure(s): VISCERAL ANGIOGRAPHY (N/A) as a surgical intervention.  The patient's history has been reviewed, patient examined, no change in status, stable for surgery.  I have reviewed the patient's chart and labs.  Questions were answered to the patient's satisfaction.     Nica Friske

## 2023-09-30 NOTE — Progress Notes (Signed)
  Progress Note   Patient: Stacey Lee FMW:[id number] DOB: [birth year] DOA: 09/29/2023     1 DOS: the patient was seen and examined on 09/30/2023   Brief hospital course: Partly taken from H&P.  Stacey Lee is a 89 y.o. female with medical history significant of syncopal episode, essential hypertension, hyperlipidemia, paroxysmal atrial fibrillation not on anticoagulation, hyperlipidemia, who was brought in from home with rectal bleed.  Patient was just discharged from the hospital 3 days ago after admission with fall and syncopal episode.  At the time she was found to have atrial fibrillation with rate control.  Her echocardiogram and carotid Dopplers that was normal.  She did have a hematoma on her anterior frontal scalp.  Due to scalp hematoma she was continued only on aspirin but no anticoagulation.   On presentation stable vitals, hemoglobin with some decreased to 8.5, INR 1.0. CT angio GI bleed shows positive for active GI bleed in the distal ascending colon close to the hepatic flexure. There is severe sigmoid diverticulosis no bowel obstruction normal appendix and cholelithiasis.   Vascular surgery was consulted and they are planning to do visceral angiography and possible intervention.  9/4: Vital stable, hemoglobin with further decreased to 6.7, 2 units of PRBC was ordered, only 1 was given before procedure. S/p embolization of hepatic flexure branch of SMA by vascular surgery.  Tolerated the procedure well.  Received second unit after the procedure.  Assessment and Plan: * Lower GI bleed Anemia secondary to acute blood loss.  Secondary to lower GI bleed s/p embolization of hepatic flexure branch of SMA by vascular surgery. Received 2 unit of PRBC -Monitor hemoglobin -Transfuse if below 7  Paroxysmal atrial fibrillation (HCC) Currently in sinus rhythm. Not on any rate limiting or anticoagulation at home -Continue to monitor -As needed metoprolol  Essential hypertension Blood  pressure currently within goal. -Holding home amlodipine  for concern of acute bleeding - Home amlodipine  can be restarted if needed  Mixed hyperlipidemia - Continue home statin   Subjective: Patient was seen after the procedure.  Denies any more bleeding.  No abdominal pain.  Patient lives alone at baseline.  Son and daughter was at bedside  Physical Exam: Vitals:   09/30/23 1218 09/30/23 1230 09/30/23 1245 09/30/23 1300  BP: 124/66 115/70 (!) 120/59 119/64  Pulse: 83 81 84 94  Resp: 19 19 15 20   Temp:      TempSrc:      SpO2: 96% 99% 98% 96%  Weight:      Height:       General.  Frail elderly lady, in no acute distress. Pulmonary.  Lungs clear bilaterally, normal respiratory effort. CV.  Regular rate and rhythm, no JVD, rub or murmur. Abdomen.  Soft, nontender, nondistended, BS positive. CNS.  Alert and oriented .  No focal neurologic deficit. Extremities.  No edema, no cyanosis, pulses intact and symmetrical.  Data Reviewed: Prior data reviewed  Family Communication: Discussed with son and daughter at bedside  Disposition: Status is: Inpatient Remains inpatient appropriate because: Severity of illness  Planned Discharge Destination: Home with Home Health  DVT prophylaxis.  SCDs Time spent: 45 minutes  This record has been created using Conservation officer, historic buildings. Errors have been sought and corrected,but may not always be located. Such creation errors do not reflect on the standard of care.   Author: Amaryllis Dare, MD 09/30/2023 1:46 PM  For on call review [email or web address].

## 2023-09-30 NOTE — Assessment & Plan Note (Signed)
 Blood pressure currently within goal. -Holding home amlodipine  for concern of acute bleeding - Home amlodipine  can be restarted if needed

## 2023-09-30 NOTE — H&P (View-Only) (Signed)
 Hospital Consult    Reason for Consult:  GI Bleed Requesting Physician:  Dr Ester Sharps MD  MRN #:  [id number]  History of Present Illness: [name] who presents to the ED for evaluation of Rectal Bleeding   I review a medical DC summary from 2 days ago, patient was observed overnight for syncope.  History of HTN, HLD, New onset A-fib during this recent admission, with anticoagulation being held for a few days, due to scalp hematoma. Patient states she is to follow up this Friday with someone for a monitor device for her syncope. Patient returned to the emergency department yesterday with 2-3 days of bloody stools.   On work up patient underwent CTA of the Abdomen and pelvis revealing bleeding of the intestinal tract at the hepatic flexure. Vascular Surgery was consulted to evaluate.   Past Medical History:  Diagnosis Date   Glaucoma    Hyperlipidemia    Hypertension     Past Surgical History:  Procedure Laterality Date   ABDOMINAL HYSTERECTOMY     BREAST BIOPSY Right 1954   negative   COLONOSCOPY N/A 06/14/2014   Procedure: COLONOSCOPY;  Surgeon: Deward CINDERELLA Piedmont, MD;  Location: [facility] ENDOSCOPY;  Service: Gastroenterology;  Laterality: N/A;   EYE SURGERY      Allergies  Allergen Reactions   Acetanilide Shortness Of Breath and Nausea Only   Acetazolamide Nausea Only and Other (See Comments)    Other Reaction: SOB, dizzy (po) Other Reaction: SOB, dizzy (po)    Penicillins Swelling    Prior to Admission medications   Medication Sig Start Date End Date Taking? Authorizing Provider  amLODipine  (NORVASC ) 5 MG tablet Take 5 mg by mouth daily. 08/24/23  Yes [provider]  hydrocortisone 2.5 % cream Apply topically 3 (three) times daily. 09/28/23 10/08/23 Yes [provider]  lisinopril  (ZESTRIL ) 30 MG tablet Take 30 mg by mouth daily. 09/13/23  Yes [provider]  amLODipine  (NORVASC ) 2.5 MG tablet TAKE 1 TABLET BY MOUTH ONCE DAILY 04/12/17    [provider]  Calcium  Carb-Cholecalciferol  (CALCIUM  600 + D PO) Take 1 tablet by mouth 2 (two) times daily.    [provider]  Coenzyme Q10 (CO Q 10) 100 MG CAPS Take 1 tablet by mouth daily.    [provider]  ibuprofen (ADVIL,MOTRIN) 200 MG tablet Take 400 mg by mouth every 6 (six) hours as needed for headache or moderate pain.    [provider]  lovastatin (MEVACOR) 20 MG tablet Take 20 mg by mouth 3 (three) times a week. Monday, Wednesday, and Friday    [provider]  Multiple Vitamin (MULTIVITAMIN WITH MINERALS) TABS tablet Take 1 tablet by mouth daily.    [provider]  nitroGLYCERIN  (NITROSTAT ) 0.4 MG SL tablet Place 1 tablet (0.4 mg total) under the tongue every 5 (five) minutes as needed (hypertension). Please only use for symptoms of lightheadedness AND blood pressure above 150 on top AND above 100 on the bottom 06/01/22 06/01/23  Bradler, Evan K, MD  Omega-3 Fatty Acids (FISH OIL) 1000 MG CAPS Take 1 capsule by mouth 2 (two) times daily.    [provider]  prednisoLONE  acetate (PRED FORTE ) 1 % ophthalmic suspension Place 1 drop into the left eye daily.    [provider]  Red Yeast Rice 600 MG CAPS Take 1 capsule by mouth 2 (two) times daily.    [provider]  RESTASIS 0.05 % ophthalmic emulsion INSTILL  1 DROP INTO EACH EYE TWICE DAILY 08/01/17   [provider]    Social History   Socioeconomic History   Marital status: Married    Spouse name: Not on file   Number of children: Not on file   Years of education: Not on file   Highest education level: Not on file  Occupational History   Not on file  Tobacco Use   Smoking status: Never   Smokeless tobacco: Never  Vaping Use   Vaping status: Never Used  Substance and Sexual Activity   Alcohol use: Not Currently   Drug use: Never   Sexual activity: Not on file  Other Topics Concern   Not on file  Social History Narrative   Not on  file   Social Drivers of Health   Financial Resource Strain: Low Risk  (09/23/2023)   Received from [facility]   Overall Financial Resource Strain (CARDIA)    Difficulty of Paying Living Expenses: Not hard at all  Food Insecurity: No Food Insecurity (09/26/2023)   Hunger Vital Sign    Worried About Running Out of Food in the Last Year: Never true    Ran Out of Food in the Last Year: Never true  Transportation Needs: No Transportation Needs (09/26/2023)   PRAPARE - Administrator, Civil Service (Medical): No    Lack of Transportation (Non-Medical): No  Physical Activity: Not on file  Stress: Not on file  Social Connections: Not on file  Intimate Partner Violence: Not At Risk (09/26/2023)   Humiliation, Afraid, Rape, and Kick questionnaire    Fear of Current or Ex-Partner: No    Emotionally Abused: No    Physically Abused: No    Sexually Abused: No     Family History  Problem Relation Age of Onset   Prostate cancer Father    Tuberculosis Maternal Grandfather    Breast cancer Neg Hx    Kidney cancer Neg Hx    Bladder Cancer Neg Hx     ROS: Otherwise negative unless mentioned in HPI  Physical Examination  Vitals:   09/30/23 0500 09/30/23 0600  BP: (!) 121/59 (!) 116/52  Pulse:  84  Resp: 19 16  Temp: 98.4 F (36.9 C)   SpO2: 100% 99%   Body mass index is 21.1 kg/m.  General:  WDWN in NAD Gait: Not observed HENT: WNL, normocephalic Pulmonary: normal non-labored breathing, without Rales, rhonchi,  wheezing Cardiac: irregular, Hx New Onset Afib, without  Murmurs, rubs or gallops; without carotid bruits Abdomen: Positive bowel sounds throughout, soft, NT/ND, no masses Skin: without rashes Vascular Exam/Pulses: Palpable Pulses throughout all extremities are warm to touch.  Extremities: without ischemic changes, without Gangrene , without cellulitis; without open wounds;  Musculoskeletal: no muscle wasting or atrophy  Neurologic: A&O X 3;   No focal weakness or paresthesias are detected; speech is fluent/normal Psychiatric:  The pt has Normal affect. Lymph:  Unremarkable  CBC    Component Value Date/Time   WBC 7.6 09/30/2023 0510   RBC 2.20 (L) 09/30/2023 0510   HGB 6.7 (L) 09/30/2023 0510   HCT 22.0 (L) 09/30/2023 0510   PLT 184 09/30/2023 0510   MCV 100.0 09/30/2023 0510   MCH 30.5 09/30/2023 0510   MCHC 30.5 09/30/2023 0510   RDW 14.8 09/30/2023 0510   LYMPHSABS 2.8 09/29/2023 1348   MONOABS 0.8 09/29/2023 1348   EOSABS 0.1 09/29/2023 1348   BASOSABS 0.0 09/29/2023 1348    BMET  Component Value Date/Time   NA 138 09/30/2023 0510   K 3.7 09/30/2023 0510   CL 107 09/30/2023 0510   CO2 24 09/30/2023 0510   GLUCOSE 148 (H) 09/30/2023 0510   BUN 20 09/30/2023 0510   BUN 15 09/01/2017 0820   CREATININE 0.69 09/30/2023 0510   CALCIUM  8.0 (L) 09/30/2023 0510   GFRNONAA >60 09/30/2023 0510   GFRAA 79 09/01/2017 0820    COAGS: Lab Results  Component Value Date   INR 1.0 09/29/2023     Non-Invasive Vascular Imaging:   EXAM:09/29/23 CTA ABDOMEN AND PELVIS WITHOUT AND WITH CONTRAST   TECHNIQUE: Multidetector CT imaging of the abdomen and pelvis was performed using the standard protocol during bolus administration of intravenous contrast. Multiplanar reconstructed images and MIPs were obtained and reviewed to evaluate the vascular anatomy.   RADIATION DOSE REDUCTION: This exam was performed according to the departmental dose-optimization program which includes automated exposure control, adjustment of the mA and/or kV according to patient size and/or use of iterative reconstruction technique.   CONTRAST:  OMNIPAQUE  IOHEXOL  350 MG/ML SOLN   COMPARISON:  CT abdomen pelvis dated 09/17/2017.   FINDINGS: Evaluation is limited due to streak artifact caused by patient's arms.   VASCULAR   Aorta: Mild atherosclerotic calcification. No aneurysmal dilatation or dissection. No periaortic fluid  collection.   Celiac: There is atherosclerotic calcification of the origin of the celiac trunk with luminal narrowing. The celiac artery and its major branches remain patent.   SMA: The SMA is patent.   Renals: The renal arteries are patent. Small accessory right renal arteries noted.   IMA: The IMA is patent.   Inflow: Mild atherosclerotic calcification. No aneurysmal dilatation or dissection. The iliac arteries are patent.   Proximal Outflow: The visualized proximal on fluids patent.   Veins: The IVC is unremarkable. The SMV, splenic vein, and main portal vein are patent. No portal venous gas.   Review of the MIP images confirms the above findings.   NON-VASCULAR   Lower chest: An area of atelectasis/scarring in the right middle lobe. Calcification of the mitral annulus.   No intra-abdominal free air or free fluid.   Hepatobiliary: The liver is unremarkable. Small gallstone. No pericholecystic fluid or evidence of acute cholecystitis by CT.   Pancreas: Unremarkable. No pancreatic ductal dilatation or surrounding inflammatory changes.   Spleen: Normal in size without focal abnormality.   Adrenals/Urinary Tract: The adrenal glands are unremarkable. There is no hydronephrosis or nephrolithiasis on either side. The visualized ureters and urinary bladder appear unremarkable.   Stomach/Bowel: There is severe sigmoid diverticulosis. Contrast pooling noted in the distal ascending colon in the right upper abdomen and close to the hepatic flexure (coronal series 14 images 66/75 and series 21, image 62). Underlying mass/malignancy as the cause of bleed is not excluded. There is no bowel obstruction or active inflammation. The appendix is normal.   Lymphatic: No adenopathy.   Reproductive: Hysterectomy.  No suspicious adnexal masses.   Other: None   Musculoskeletal: Osteopenia with degenerative changes of the spine. No acute osseous pathology.   IMPRESSION: 1.  Positive for active GI bleed in the distal ascending colon close to the hepatic flexure. 2. Severe sigmoid diverticulosis. No bowel obstruction. Normal appendix. 3. Cholelithiasis. 4.  Aortic Atherosclerosis (ICD10-I70.0).  Statin:  No. Beta Blocker:  No. Aspirin:  No. ACEI:  Yes.   ARB:  No. CCB use:  Yes Other antiplatelets/anticoagulants:  No. Prior note states on anticoagulation but held for syncopal  episode.    ASSESSMENT/PLAN: [name] who presents to [facility] emergency department after 2 to 3 days of bloody stools.  Patient was admitted to the hospital 2 days prior for syncopal episode in which she was discharged home to follow-up with cardiology for Holter monitor.  Patient states she has had a syncopal episode a year ago and she was placed on Eliquis at that time but not really told why.  Patient underwent CTA of the abdomen pelvis which revealed an active GI bleed in the distal ascending colon close to the hepatic flexure.  Vascular surgery at this time recommends patient undergo a visceral angiogram with possible embolization.  I discussed in detail at bedside in the emergency review this morning with the patient and the family member of the procedure, benefits, risk, complications.  Both verbalized her understanding and wished to proceed.  I answered all her questions.  Patient endorses she has been n.p.o. since lunchtime yesterday.  Patient does have a history of oral anticoagulation but this was stopped 5 days ago after her syncopal episode.  Patient's current hemoglobin this morning is 6.7 and creatinine 22.  Patient's BUN 20 and creatinine 1.60   -I discussed the case in detail with Dr. Selinda Gu MD and he agrees with plan.   Gwendlyn JONELLE Shank [facility] 09/30/2023 7:12 AM

## 2023-10-01 ENCOUNTER — Other Ambulatory Visit: Payer: Self-pay

## 2023-10-01 ENCOUNTER — Encounter: Payer: Self-pay | Admitting: Vascular Surgery

## 2023-10-01 DIAGNOSIS — I10 Essential (primary) hypertension: Secondary | ICD-10-CM | POA: Diagnosis not present

## 2023-10-01 DIAGNOSIS — I480 Paroxysmal atrial fibrillation: Secondary | ICD-10-CM | POA: Diagnosis not present

## 2023-10-01 DIAGNOSIS — K922 Gastrointestinal hemorrhage, unspecified: Secondary | ICD-10-CM | POA: Diagnosis not present

## 2023-10-01 DIAGNOSIS — E782 Mixed hyperlipidemia: Secondary | ICD-10-CM | POA: Diagnosis not present

## 2023-10-01 LAB — CBC
HCT: 29.4 % — ABNORMAL LOW (ref 36.0–46.0)
Hemoglobin: 9.9 g/dL — ABNORMAL LOW (ref 12.0–15.0)
MCH: 30 pg (ref 26.0–34.0)
MCHC: 33.7 g/dL (ref 30.0–36.0)
MCV: 89.1 fL (ref 80.0–100.0)
Platelets: 175 K/uL (ref 150–400)
RBC: 3.3 MIL/uL — ABNORMAL LOW (ref 3.87–5.11)
RDW: 15.9 % — ABNORMAL HIGH (ref 11.5–15.5)
WBC: 13.7 K/uL — ABNORMAL HIGH (ref 4.0–10.5)
nRBC: 0 % (ref 0.0–0.2)

## 2023-10-01 LAB — BPAM RBC
Blood Product Expiration Date: 202510042359
Blood Product Expiration Date: 202510052359
ISSUE DATE / TIME: 202509040859
ISSUE DATE / TIME: 202509041025
Unit Type and Rh: 5100
Unit Type and Rh: 5100

## 2023-10-01 LAB — TYPE AND SCREEN
ABO/RH(D): O POS
Antibody Screen: NEGATIVE
Unit division: 0
Unit division: 0

## 2023-10-01 MED ORDER — FE FUM-VIT C-VIT B12-FA 460-60-0.01-1 MG PO CAPS: 1.0000 | ORAL_CAPSULE | Freq: Every day | ORAL | Status: DC

## 2023-10-01 MED ORDER — FE FUM-VIT C-VIT B12-FA 460-60-0.01-1 MG PO CAPS
1.0000 | ORAL_CAPSULE | Freq: Every day | ORAL | 0 refills | Status: AC
Start: 2023-10-02 — End: ?
  Filled 2023-10-01: qty 90, 90d supply, fill #0

## 2023-10-01 NOTE — TOC Transition Note (Signed)
 Transition of Care [facility]) - Discharge Note   Patient Details  Name: Stacey Lee MRN: [id number] Date of Birth: 08/10/1930  [facility]) CM/SW Contact:  Lauraine JAYSON Carpen, LCSW Phone Number: 10/01/2023, 2:49 PM   Clinical Narrative:  Patient has orders to discharge home today. She was set up with [facility] last admission. They can still take her for PT and OT. Adding OT assistant as well. CSW ordered RW and 3-in-1 through [employer]. No further concerns. CSW signing off.   Final next level of care: Home w Home Health Services Barriers to Discharge: Barriers Resolved   Patient Goals and CMS Choice            Discharge Placement                Patient to be transferred to facility by: Daughter Name of family member notified: Karna Rouse Patient and family notified of of transfer: 10/01/23  Discharge Plan and Services Additional resources added to the After Visit Summary for                  DME Arranged: 3-N-1, Walker rolling DME Agency: [employer] Date DME Agency Contacted: 10/01/23   Representative spoke with at DME Agency: Selinda HH Arranged: PT, OT [facility] Agency: [facility] Date [facility] Agency Contacted: 10/01/23   Representative spoke with at [facility] Agency: [place]  Social Drivers of Health (SDOH) Interventions SDOH Screenings   Food Insecurity: No Food Insecurity (09/30/2023)  Housing: Low Risk  (09/30/2023)  Recent Concern: Housing - High Risk (09/23/2023)   Received from [facility]  Transportation Needs: No Transportation Needs (09/30/2023)  Utilities: Not At Risk (09/30/2023)  Financial Resource Strain: Low Risk  (09/23/2023)   Received from [facility]  Social Connections: Patient Declined (09/30/2023)  Tobacco Use: Low Risk  (09/26/2023)     Readmission Risk Interventions     No data to display

## 2023-10-01 NOTE — Care Management Important Message (Signed)
 Important Message  Patient Details  Name: Stacey Lee MRN: [id number] Date of Birth: 05/31/1930   Important Message Given:  Yes - Medicare IM     Rojelio [name] 10/01/2023, 11:43 AM

## 2023-10-01 NOTE — Evaluation (Signed)
 Occupational Therapy Evaluation Patient Details Name: Zephyr MAKENLEY SHIMP MRN: [id number] DOB: 26-Sep-1930 Today's Date: 10/01/2023   History of Present Illness   Giani [place] is a 89 yo female that presented to the ED for syncope. Workup showed afib. PMH of HLD, HTN. Pt DC to home, then returned after another adverse event found to have ABLA.   Clinical Impressions Ms Shealy was seen for OT evaluation this date. Prior to hospital admission, pt was IND no AD use. Pt lives alone, daughter or son available as needed. Pt currently requires SUPERVISION toilet t/f, MOD I functional mobility ~200 ft with RW use. IND seated dressing. Educated pt and family on DME recs, d/c recs, falls safety, and ECS. No skilled OT identified, will sign off. Upon hospital discharge, recommend no OT follow up.    If plan is discharge home, recommend the following:   Help with stairs or ramp for entrance;Assistance with cooking/housework     Functional Status Assessment   Patient has had a recent decline in their functional status and demonstrates the ability to make significant improvements in function in a reasonable and predictable amount of time.     Equipment Recommendations   BSC/3in1 (RW)     Recommendations for Other Services         Precautions/Restrictions   Precautions Precautions: Fall Recall of Precautions/Restrictions: Intact Restrictions Weight Bearing Restrictions Per Provider Order: No     Mobility Bed Mobility Overal bed mobility: Modified Independent                  Transfers Overall transfer level: Needs assistance Equipment used: None Transfers: Sit to/from Stand Sit to Stand: Supervision           General transfer comment: cues      Balance Overall balance assessment: No apparent balance deficits (not formally assessed)                                         ADL either performed or assessed with clinical judgement   ADL Overall ADL's :  Needs assistance/impaired                                       General ADL Comments: SUPERVISION ADL t/f no AD use, MOD I functional mobility ~200 ft with RW use.     Vision         Perception         Praxis         Pertinent Vitals/Pain Pain Assessment Pain Assessment: No/denies pain     Extremity/Trunk Assessment Upper Extremity Assessment Upper Extremity Assessment: Overall WFL for tasks assessed   Lower Extremity Assessment Lower Extremity Assessment: Overall WFL for tasks assessed       Communication Communication Communication: No apparent difficulties   Cognition Arousal: Alert Behavior During Therapy: WFL for tasks assessed/performed Cognition: No apparent impairments                               Following commands: Intact       Cueing  General Comments   Cueing Techniques: Verbal cues      Exercises     Shoulder Instructions      Home Living Family/patient expects to be discharged to::  Private residence Living Arrangements: Alone Available Help at Discharge: Family;Available PRN/intermittently Type of Home: House Home Access: Stairs to enter [employer] of Steps: 10 steps, or 2 without hand support at front if family drives thru yard Entrance Stairs-Rails: Right Home Layout: Laundry or work area in basement;Able to live on main level with bedroom/bathroom     Bathroom Shower/Tub: Chief Strategy Officer: Standard     Home Equipment: Cane - single point;Shower seat          Prior Functioning/Environment Prior Level of Function : Independent/Modified Independent             Mobility Comments: son comes every thursday for appts, hair appts, take her grocery shopping. ADLs Comments: independent, does her own laundry, cooking    OT Problem List: Impaired balance (sitting and/or standing);Cardiopulmonary status limiting activity   OT Treatment/Interventions:        OT  Goals(Current goals can be found in the care plan section)   Acute Rehab OT Goals Patient Stated Goal: return to PLOF OT Goal Formulation: With patient Time For Goal Achievement: 10/01/23 Potential to Achieve Goals: Good ADL Goals Pt Will Perform Grooming: standing;Independently Pt Will Perform Lower Body Dressing: Independently;sit to/from stand Pt Will Transfer to Toilet: Independently;ambulating;regular height toilet   OT Frequency:       Co-evaluation              AM-PAC OT 6 Clicks Daily Activity     Outcome Measure Help from another person eating meals?: None Help from another person taking care of personal grooming?: None Help from another person toileting, which includes using toliet, bedpan, or urinal?: None Help from another person bathing (including washing, rinsing, drying)?: A Little Help from another person to put on and taking off regular upper body clothing?: None Help from another person to put on and taking off regular lower body clothing?: None 6 Click Score: 23   End of Session Equipment Utilized During Treatment: Rolling walker (2 wheels)  Activity Tolerance: Patient tolerated treatment well Patient left: in bed;with call bell/phone within reach;with family/visitor present  OT Visit Diagnosis: Unsteadiness on feet (R26.81);History of falling (Z91.81)                Time: 8668-8597 OT Time Calculation (min): 31 min Charges:  OT General Charges $OT Visit: 1 Visit OT Evaluation $OT Eval Low Complexity: 1 Low OT Treatments $Self Care/Home Management : 8-22 mins  Elston Slot, M.S. OTR/L  10/01/23, 2:54 PM  ascom [phone]

## 2023-10-01 NOTE — TOC CM/SW Note (Signed)
 Patient is not able to walk the distance required to go the bathroom, or he/she is unable to safely negotiate stairs required to access the bathroom.  A 3in1 BSC will alleviate this problem

## 2023-10-01 NOTE — Progress Notes (Signed)
 Progress Note    10/01/2023 11:22 AM 1 Day Post-Op  Subjective:  Stacey Lee is a 89 yo female who presented to the ED for evaluation of Rectal Bleeding on 09/28/23.  On work up patient underwent CTA of the Abdomen and pelvis revealing bleeding of the intestinal tract at the hepatic flexure   Patient is now POD #1 from:   Procedure(s) Performed:             1.  Ultrasound guidance for vascular access right femoral artery             2.  Catheter placement into the hepatic flexure branch off of the right colonic branch of the SMA from right femoral approach             3.  Aortogram and selective angiogram of the SMA and the hepatic flexure branch             4.  Microbead embolization of hepatic flexure branch of the SMA with approximately 2 cc of 600  polyvinyl alcohol beads.             5.  StarClose closure device right femoral artery  Patient is resting comfortably in bed this morning.  No complaints.  No complications overnight.  Patient's hemoglobin is up this morning to 5.9.  Vital signs are remained stable.   Vitals:   10/01/23 0735 10/01/23 1101  BP: 136/77 123/65  Pulse: 85 88  Resp: 18 18  Temp: 97.8 F (36.6 C) 97.9 F (36.6 C)  SpO2: 98% 98%   Physical Exam: Cardiac:  RR Hx Atrial Fibrillation. No murmurs appreciated.  Lungs: Nonlabored breathing, clear on auscultation throughout.  No rales rhonchi or wheezing. Incisions: Right femoral puncture site.  Dressing clean dry and intact no hematoma seroma noted Extremities: All extremities with palpable pulses and warm to touch. Abdomen: Positive bowel sounds throughout, nontender nondistended. Neurologic: Alert and oriented x 3, answers all questions follows commands appropriately.  CBC    Component Value Date/Time   WBC 13.7 (H) 10/01/2023 0214   RBC 3.30 (L) 10/01/2023 0214   HGB 9.9 (L) 10/01/2023 0214   HCT 29.4 (L) 10/01/2023 0214   PLT 175 10/01/2023 0214   MCV 89.1 10/01/2023 0214   MCH 30.0 10/01/2023  0214   MCHC 33.7 10/01/2023 0214   RDW 15.9 (H) 10/01/2023 0214   LYMPHSABS 2.8 09/29/2023 1348   MONOABS 0.8 09/29/2023 1348   EOSABS 0.1 09/29/2023 1348   BASOSABS 0.0 09/29/2023 1348    BMET    Component Value Date/Time   NA 138 09/30/2023 0510   K 3.7 09/30/2023 0510   CL 107 09/30/2023 0510   CO2 24 09/30/2023 0510   GLUCOSE 148 (H) 09/30/2023 0510   BUN 20 09/30/2023 0510   BUN 15 09/01/2017 0820   CREATININE 0.69 09/30/2023 0510   CALCIUM  8.0 (L) 09/30/2023 0510   GFRNONAA >60 09/30/2023 0510   GFRAA 79 09/01/2017 0820    INR    Component Value Date/Time   INR 1.0 09/29/2023 1348     Intake/Output Summary (Last 24 hours) at 10/01/2023 1122 Last data filed at 10/01/2023 0900 Gross per 24 hour  Intake 701.25 ml  Output --  Net 701.25 ml     Assessment/Plan:  89 y.o. female is s/p SEE ABOVE  1 Day Post-Op   PLAN Per vascular surgery procedure was successful.  Patient is not having any more bloody bowel movements.  Patient's hemoglobin remained stable and  is elevated at 9.9 this morning. Vascular surgery is okay with patient discharge today if medically stable.  Patient will follow-up with vein and vascular clinic as scheduled.  DVT prophylaxis: None at this time due to GI bleeding.   Gwendlyn JONELLE Shank [facility] 10/01/2023 11:22 AM

## 2023-10-01 NOTE — Discharge Summary (Signed)
 Physician Discharge Summary   Patient: Stacey Lee MRN: [id number] DOB: 05-23-1930  Admit date:     09/29/2023  Discharge date: 10/01/23  Discharge Physician: Amaryllis Dare   PCP: Sadie Manna, MD   Recommendations at discharge:  Please obtain CBC and BMP on follow-up Follow-up with primary care provider Follow-up with vascular surgery  Discharge Diagnoses: Principal Problem:   Lower GI bleed Active Problems:   Paroxysmal atrial fibrillation [facility])   Essential hypertension   Mixed hyperlipidemia   Hospital Course: Partly taken from H&P.  Stacey Lee is a 89 y.o. female with medical history significant of syncopal episode, essential hypertension, hyperlipidemia, paroxysmal atrial fibrillation not on anticoagulation, hyperlipidemia, who was brought in from home with rectal bleed.  Patient was just discharged from the hospital 3 days ago after admission with fall and syncopal episode.  At the time she was found to have atrial fibrillation with rate control.  Her echocardiogram and carotid Dopplers that was normal.  She did have a hematoma on her anterior frontal scalp.  Due to scalp hematoma she was continued only on aspirin but no anticoagulation.   On presentation stable vitals, hemoglobin with some decreased to 8.5, INR 1.0. CT angio GI bleed shows positive for active GI bleed in the distal ascending colon close to the hepatic flexure. There is severe sigmoid diverticulosis no bowel obstruction normal appendix and cholelithiasis.   Vascular surgery was consulted and they are planning to do visceral angiography and possible intervention.  9/4: Vital stable, hemoglobin with further decreased to 6.7, 2 units of PRBC was ordered, only 1 was given before procedure. S/p embolization of hepatic flexure branch of SMA by vascular surgery.  Tolerated the procedure well.  Received second unit after the procedure.  9/5: Hemodynamically stable, hemoglobin stable at 9.9, no more bleeding.   Vascular surgery cleared her for discharge with outpatient follow-up.  PT recommended home health which was ordered.  Patient was also started on iron supplement.  Patient was instructed to avoid NSAID to decrease the risk of bleeding.  She will continue the rest of her home medications and follow-up with her providers for further assistance.  Assessment and Plan: * Lower GI bleed Anemia secondary to acute blood loss.  Secondary to lower GI bleed s/p embolization of hepatic flexure branch of SMA by vascular surgery.  No more bleeding Received 2 unit of PRBC - Hemoglobin stable at 8.9 now  Paroxysmal atrial fibrillation (HCC) Currently in sinus rhythm. Not on any rate limiting or anticoagulation at home -Continue to monitor -As needed metoprolol  Essential hypertension Blood pressure currently within goal. -Holding home amlodipine  for concern of acute bleeding - Home amlodipine  can be restarted if needed  Mixed hyperlipidemia - Continue home statin  Consultants: Vascular surgery Procedures performed: Embolization of hepatic branch of SMA Disposition: Home health Diet recommendation:  Discharge Diet Orders (From admission, onward)     Start     Ordered   10/01/23 0000  Diet - low sodium heart healthy        10/01/23 1403           Regular diet DISCHARGE MEDICATION: Allergies as of 10/01/2023       Reactions   Acetanilide Shortness Of Breath, Nausea Only   Acetazolamide Nausea Only, Other (See Comments)   Other Reaction: SOB, dizzy (po) Other Reaction: SOB, dizzy (po)   Penicillins Swelling        Medication List     STOP taking these medications  ibuprofen 200 MG tablet Commonly known as: ADVIL   lisinopril  30 MG tablet Commonly known as: ZESTRIL        TAKE these medications    amLODipine  5 MG tablet Commonly known as: NORVASC  Take 5 mg by mouth daily.   CALCIUM  600 + D PO Take 1 tablet by mouth 2 (two) times daily.   Co Q 10 100 MG Caps Take  1 tablet by mouth daily.   dorzolamide  2 % ophthalmic solution Commonly known as: TRUSOPT  Place 1 drop into both eyes 2 (two) times daily.   Fe Fum-Vit C-Vit B12-FA Caps capsule Commonly known as: TRIGELS-F FORTE Take 1 capsule by mouth daily after breakfast. Start taking on: October 02, 2023   Fish Oil 1000 MG Caps Take 1 capsule by mouth 2 (two) times daily.   hydrocortisone 2.5 % cream Apply topically 3 (three) times daily.   lovastatin 20 MG tablet Commonly known as: MEVACOR Take 20 mg by mouth 3 (three) times a week. Monday, Wednesday, and Friday   multivitamin with minerals Tabs tablet Take 1 tablet by mouth daily.   nitroGLYCERIN  0.4 MG SL tablet Commonly known as: Nitrostat  Place 1 tablet (0.4 mg total) under the tongue every 5 (five) minutes as needed (hypertension). Please only use for symptoms of lightheadedness AND blood pressure above 150 on top AND above 100 on the bottom   prednisoLONE  acetate 1 % ophthalmic suspension Commonly known as: PRED FORTE  Place 1 drop into the left eye daily.   Red Yeast Rice 600 MG Caps Take 1 capsule by mouth 2 (two) times daily.        Follow-up Information     Sadie Manna, MD. Schedule an appointment as soon as possible for a visit in 1 week(s).   Specialty: Internal Medicine Contact information: [address] [place] [address] [phone]         Marea Selinda RAMAN, MD. Schedule an appointment as soon as possible for a visit in 1 week(s).   Specialties: Vascular Surgery, Radiology, Interventional Cardiology Contact information: [address] [place] [address] [phone]                Discharge Exam: Fredricka Weights   09/29/23 1345  Weight: 49 kg   General.  Frail elderly lady, in no acute distress. Pulmonary.  Lungs clear bilaterally, normal respiratory effort. CV.  Regular rate and rhythm, no JVD, rub or murmur. Abdomen.  Soft, nontender, nondistended, BS  positive. CNS.  Alert and oriented .  No focal neurologic deficit. Extremities.  No edema, no cyanosis, pulses intact and symmetrical. Psychiatry.  Judgment and insight appears normal.   Condition at discharge: stable  The results of significant diagnostics from this hospitalization (including imaging, microbiology, ancillary and laboratory) are listed below for reference.   Imaging Studies: PERIPHERAL VASCULAR CATHETERIZATION Result Date: 09/30/2023 See surgical note for result.  CT ANGIO GI BLEED Result Date: 09/29/2023 CLINICAL DATA:  Painless lower GI bleed. History of diverticular bleed. EXAM: CTA ABDOMEN AND PELVIS WITHOUT AND WITH CONTRAST TECHNIQUE: Multidetector CT imaging of the abdomen and pelvis was performed using the standard protocol during bolus administration of intravenous contrast. Multiplanar reconstructed images and MIPs were obtained and reviewed to evaluate the vascular anatomy. RADIATION DOSE REDUCTION: This exam was performed according to the departmental dose-optimization program which includes automated exposure control, adjustment of the mA and/or kV according to patient size and/or use of iterative reconstruction technique. CONTRAST:  OMNIPAQUE  IOHEXOL  350 MG/ML  SOLN COMPARISON:  CT abdomen pelvis dated 09/17/2017. FINDINGS: Evaluation is limited due to streak artifact caused by patient's arms. VASCULAR Aorta: Mild atherosclerotic calcification. No aneurysmal dilatation or dissection. No periaortic fluid collection. Celiac: There is atherosclerotic calcification of the origin of the celiac trunk with luminal narrowing. The celiac artery and its major branches remain patent. SMA: The SMA is patent. Renals: The renal arteries are patent. Small accessory right renal arteries noted. IMA: The IMA is patent. Inflow: Mild atherosclerotic calcification. No aneurysmal dilatation or dissection. The iliac arteries are patent. Proximal Outflow: The visualized proximal on fluids  patent. Veins: The IVC is unremarkable. The SMV, splenic vein, and main portal vein are patent. No portal venous gas. Review of the MIP images confirms the above findings. NON-VASCULAR Lower chest: An area of atelectasis/scarring in the right middle lobe. Calcification of the mitral annulus. No intra-abdominal free air or free fluid. Hepatobiliary: The liver is unremarkable. Small gallstone. No pericholecystic fluid or evidence of acute cholecystitis by CT. Pancreas: Unremarkable. No pancreatic ductal dilatation or surrounding inflammatory changes. Spleen: Normal in size without focal abnormality. Adrenals/Urinary Tract: The adrenal glands are unremarkable. There is no hydronephrosis or nephrolithiasis on either side. The visualized ureters and urinary bladder appear unremarkable. Stomach/Bowel: There is severe sigmoid diverticulosis. Contrast pooling noted in the distal ascending colon in the right upper abdomen and close to the hepatic flexure (coronal series 14 images 66/75 and series 21, image 62). Underlying mass/malignancy as the cause of bleed is not excluded. There is no bowel obstruction or active inflammation. The appendix is normal. Lymphatic: No adenopathy. Reproductive: Hysterectomy.  No suspicious adnexal masses. Other: None Musculoskeletal: Osteopenia with degenerative changes of the spine. No acute osseous pathology. IMPRESSION: 1. Positive for active GI bleed in the distal ascending colon close to the hepatic flexure. 2. Severe sigmoid diverticulosis. No bowel obstruction. Normal appendix. 3. Cholelithiasis. 4.  Aortic Atherosclerosis (ICD10-I70.0). Electronically Signed   By: Vanetta Chou M.D.   On: 09/29/2023 20:55   US  Carotid Bilateral Result Date: 09/27/2023 CLINICAL DATA:  Syncope EXAM: BILATERAL CAROTID DUPLEX ULTRASOUND TECHNIQUE: Elnor scale imaging, color Doppler and duplex ultrasound were performed of bilateral carotid and vertebral arteries in the neck. COMPARISON:  None Available.  FINDINGS: Criteria: Quantification of carotid stenosis is based on velocity parameters that correlate the residual internal carotid diameter with NASCET-based stenosis levels, using the diameter of the distal internal carotid lumen as the denominator for stenosis measurement. The following velocity measurements were obtained: RIGHT ICA: 95 cm/sec CCA: 66 cm/sec SYSTOLIC ICA/CCA RATIO:  1.4 ECA: 62 cm/sec LEFT ICA: 101 cm/sec CCA: 66 cm/sec SYSTOLIC ICA/CCA RATIO:  1.5 ECA: 77 cm/sec RIGHT CAROTID ARTERY: Patent. RIGHT VERTEBRAL ARTERY:  Antegrade. LEFT CAROTID ARTERY:  Patent. LEFT VERTEBRAL ARTERY:  Antegrade. IMPRESSION: 1. Right carotid system: Within normal limits. No evidence of flow-limiting stenosis. 2. Left carotid system: Within normal limits. No evidence of flow-limiting stenosis. Electronically Signed   By: Maude Naegeli M.D.   On: 09/27/2023 12:31   ECHOCARDIOGRAM COMPLETE Result Date: 09/27/2023    ECHOCARDIOGRAM REPORT   Patient Name:   HONI NAME Date of Exam: 09/27/2023 Medical Rec #:  [id number]    Height:       60.0 in Accession #:    [id number]   Weight:       108.0 lb Date of Birth:  1930-05-03    BSA:          1.437 m Patient Age:    89 years  BP:           104/57 mmHg Patient Gender: F            HR:           90 bpm. Exam Location:  [facility] Procedure: 2D Echo, Cardiac Doppler and Color Doppler (Both Spectral and Color            Flow Doppler were utilized during procedure). Indications:     Syncope R55  History:         Patient has no prior history of Echocardiogram examinations.  Sonographer:     Thedora Louder RDCS, FASE Referring Phys:  [id number] POSEY ADEFESO Diagnosing Phys: Denyse Bathe IMPRESSIONS  1. Left ventricular ejection fraction, by estimation, is 60 to 65%. The left ventricle has normal function. The left ventricle has no regional wall motion abnormalities. There is moderate concentric left ventricular hypertrophy. Left ventricular diastolic parameters are consistent with  Grade II diastolic dysfunction (pseudonormalization).  2. Right ventricular systolic function is normal. The right ventricular size is normal.  3. Left atrial size was severely dilated.  4. Right atrial size was mildly dilated.  5. The mitral valve is abnormal. Mild to moderate mitral valve regurgitation. No evidence of mitral stenosis. Severe mitral annular calcification.  6. The aortic valve is normal in structure. Aortic valve regurgitation is mild to moderate. Aortic valve sclerosis/calcification is present, without any evidence of aortic stenosis.  7. The inferior vena cava is normal in size with greater than 50% respiratory variability, suggesting right atrial pressure of 3 mmHg. FINDINGS  Left Ventricle: Left ventricular ejection fraction, by estimation, is 60 to 65%. The left ventricle has normal function. The left ventricle has no regional wall motion abnormalities. Strain was performed and the global longitudinal strain is indeterminate. The left ventricular internal cavity size was normal in size. There is moderate concentric left ventricular hypertrophy. Left ventricular diastolic parameters are consistent with Grade II diastolic dysfunction (pseudonormalization). Right Ventricle: The right ventricular size is normal. No increase in right ventricular wall thickness. Right ventricular systolic function is normal. Left Atrium: Left atrial size was severely dilated. Right Atrium: Right atrial size was mildly dilated. Pericardium: There is no evidence of pericardial effusion. Mitral Valve: The mitral valve is abnormal. Severe mitral annular calcification. Mild to moderate mitral valve regurgitation. No evidence of mitral valve stenosis. MV peak gradient, 6.0 mmHg. The mean mitral valve gradient is 2.0 mmHg. Tricuspid Valve: The tricuspid valve is normal in structure. Tricuspid valve regurgitation is mild . No evidence of tricuspid stenosis. Aortic Valve: The aortic valve is normal in structure. Aortic valve  regurgitation is mild to moderate. Aortic regurgitation PHT measures 533 msec. Aortic valve sclerosis/calcification is present, without any evidence of aortic stenosis. Aortic valve peak  gradient measures 8.0 mmHg. Pulmonic Valve: The pulmonic valve was normal in structure. Pulmonic valve regurgitation is not visualized. Aorta: The aortic root is normal in size and structure. Venous: The inferior vena cava is normal in size with greater than 50% respiratory variability, suggesting right atrial pressure of 3 mmHg. IAS/Shunts: No atrial level shunt detected by color flow Doppler. Additional Comments: 3D was performed not requiring image post processing on an independent workstation and was indeterminate.  LEFT VENTRICLE PLAX 2D LVIDd:         3.40 cm   Diastology LVIDs:         2.10 cm   LV e' medial:    7.07 cm/s LV PW:  1.30 cm   LV E/e' medial:  18.5 LV IVS:        1.50 cm   LV e' lateral:   10.10 cm/s LVOT diam:     1.80 cm   LV E/e' lateral: 13.0 LV SV:         63 LV SV Index:   44 LVOT Area:     2.54 cm  RIGHT VENTRICLE RV Basal diam:  2.50 cm RV S prime:     11.00 cm/s TAPSE (M-mode): 1.9 cm LEFT ATRIUM             Index        RIGHT ATRIUM           Index LA diam:        3.90 cm 2.71 cm/m   RA Area:     17.40 cm LA Vol (A2C):   71.4 ml 49.70 ml/m  RA Volume:   40.90 ml  28.47 ml/m LA Vol (A4C):   50.6 ml 35.22 ml/m LA Biplane Vol: 60.0 ml 41.77 ml/m  AORTIC VALVE                 PULMONIC VALVE AV Area (Vmax): 2.25 cm     PV Vmax:          0.96 m/s AV Vmax:        141.50 cm/s  PV Peak grad:     3.7 mmHg AV Peak Grad:   8.0 mmHg     PR End Diast Vel: 6.15 msec LVOT Vmax:      125.00 cm/s  RVOT Peak grad:   2 mmHg LVOT Vmean:     80.000 cm/s LVOT VTI:       0.249 m AI PHT:         533 msec  AORTA Ao Root diam: 3.60 cm Ao Asc diam:  3.10 cm MITRAL VALVE MV Area (PHT): 3.53 cm     SHUNTS MV Area VTI:   2.13 cm     Systemic VTI:  0.25 m MV Peak grad:  6.0 mmHg     Systemic Diam: 1.80 cm MV Mean  grad:  2.0 mmHg MV Vmax:       1.22 m/s MV Vmean:      68.1 cm/s MV Decel Time: 215 msec MV E velocity: 131.00 cm/s MV A velocity: 42.40 cm/s MV E/A ratio:  3.09 Shaukat Khan Electronically signed by Denyse Bathe Signature Date/Time: 09/27/2023/12:23:56 PM    Final    CT Cervical Spine Wo Contrast Result Date: 09/26/2023 EXAM: CT CERVICAL SPINE WITHOUT CONTRAST 09/26/2023 03:50:16 PM TECHNIQUE: CT of the cervical spine was performed without the administration of intravenous contrast. Multiplanar reformatted images are provided for review. Automated exposure control, iterative reconstruction, and/or weight based adjustment of the mA/kV was utilized to reduce the radiation dose to as low as reasonably achievable. COMPARISON: None available. CLINICAL HISTORY: Neck trauma (Age >= 65y). Head and neck trauma. Mechanical fall. FINDINGS: CERVICAL SPINE: BONES AND ALIGNMENT: No acute fracture or traumatic malalignment. DEGENERATIVE CHANGES: Chronic disc disease and uncovertebral spurring is present at C5-6 and C6-7 bilaterally. Ankylosis is present across the disc space at C5-6 and C6-7. SOFT TISSUES: No prevertebral soft tissue swelling. IMPRESSION: 1. No acute abnormality of the cervical spine related to the reported neck trauma. 2. Chronic disc disease and uncovertebral spurring at C5-6 and C6-7 bilaterally with ankylosis across the disc space. Electronically signed by: Lonni Necessary MD 09/26/2023 04:18 PM EDT RP Workstation: [id number]  CT HEAD WO CONTRAST ( ) Result Date: 09/26/2023 EXAM: CT HEAD WITHOUT CONTRAST 09/26/2023 03:50:16 PM TECHNIQUE: CT of the head was performed without the administration of intravenous contrast. Automated exposure control, iterative reconstruction, and/or weight based adjustment of the mA/kV was utilized to reduce the radiation dose to as low as reasonably achievable. COMPARISON: None available. CLINICAL HISTORY: Head trauma, intracranial arterial injury suspected. Head and  neck trauma. FINDINGS: BRAIN AND VENTRICLES: No acute hemorrhage. No evidence of acute infarct. No hydrocephalus. No extra-axial collection. No mass effect or midline shift. Mild atrophy and white matter changes are stable. ORBITS: No acute abnormality. SINUSES: No acute abnormality. SOFT TISSUES AND SKULL: A left paramedian anterior frontal scalp hematoma is present. No underlying fracture or foreign body is present. IMPRESSION: 1. No acute intracranial abnormality. 2. Left paramedian anterior frontal scalp hematoma without underlying fracture or foreign body. Electronically signed by: Lonni Necessary MD 09/26/2023 04:16 PM EDT RP Workstation: [id number]    Microbiology: Results for orders placed or performed in visit on 10/11/19  Microscopic Examination     Status: Abnormal   Collection Time: 10/11/19 10:27 AM   Urine  Result Value Ref Range Status   WBC, UA 6-10 (A) 0 - 5 /hpf Final   RBC, Urine 0-2 0 - 2 /hpf Final   Epithelial Cells (non renal) 0-10 0 - 10 /hpf Final   Renal Epithel, UA 0-10 (A) None seen /hpf Final   Crystals Present (A) N/A Final   Crystal Type Amorphous Sediment N/A Final   Bacteria, UA Few None seen/Few Final    Labs: CBC: Recent Labs  Lab 09/26/23 1534 09/27/23 0450 09/29/23 1348 09/29/23 2303 09/30/23 0510 09/30/23 1413 10/01/23 0214  WBC 11.3*   < > 9.9 7.8 7.6 10.2 13.7*  NEUTROABS 6.8  --  6.1  --   --   --   --   HGB 11.2*   < > 8.5* 7.0* 6.7* 9.9* 9.9*  HCT 35.4*   < > 27.0* 21.9* 22.0* 29.0* 29.4*  MCV 94.7   < > 95.7 96.1 100.0 88.7 89.1  PLT 210   < > 222 188 184 178 175   < > = values in this interval not displayed.   Basic Metabolic Panel: Recent Labs  Lab 09/26/23 1534 09/27/23 0450 09/29/23 1348 09/30/23 0510  NA 137 140 137 138  K 4.6 3.9 4.2 3.7  CL 106 109 108 107  CO2 23 26 21* 24  GLUCOSE 154* 101* 150* 148*  BUN 42* 33* 24* 20  CREATININE 0.66 0.71 0.72 0.69  CALCIUM  8.6* 8.3* 8.7* 8.0*  MG  --  1.8  --   --   PHOS   --  2.8  --   --    Liver Function Tests: Recent Labs  Lab 09/26/23 1534 09/27/23 0450 09/29/23 1348 09/30/23 0510  AST 31 21 31 24   ALT 11 14 14 14   ALKPHOS 56 45 52 41  BILITOT 0.7 0.7 0.6 0.6  PROT 7.1 5.6* 6.8 5.5*  ALBUMIN 3.5 2.8* 3.4* 2.7*   CBG: No results for input(s): GLUCAP in the last 168 hours.  Discharge time spent: greater than 30 minutes.  This record has been created using Conservation officer, historic buildings. Errors have been sought and corrected,but may not always be located. Such creation errors do not reflect on the standard of care.   Signed: Amaryllis Dare, MD [facility] 10/01/2023

## 2023-10-01 NOTE — Discharge Instructions (Signed)
 Follow up with PCP and pick up medications from pharmacy. Make sure to take all antibiotics. Do not double up on narcotic/pain medication or drink alcohol during this time. Do not drive while taking narcotic medication. Call 911 or return to ER for life threatening issues or other concerns.

## 2023-10-01 NOTE — Evaluation (Signed)
 Physical Therapy Evaluation Patient Details Name: Stacey Lee MRN: [id number] DOB: November 24, 1930 Today's Date: 10/01/2023  History of Present Illness  Stacey Lee is a 89 yo female that presented to the ED for syncope. Workup showed afib. PMH of HLD, HTN. Pt DC to home, then returned after another adverse event found to have ABLA.  Clinical Impression  Pt reports to be feeling pretty good today, is able to perform all mobility without any DOE at this point, an improvement since arrival. Pt remains hesitant with AMB unsupported, but with RW (first time use) is able to AMB at speeds appropriate for household AMB and without LOB. Pt attests RW improved her confidence overall. Would be a good candidate for HHPT at DC, but anticipate easy transition to OPPT thereafter should she remain with deficit. Will continue to follow.       If plan is discharge home, recommend the following: Assist for transportation;Assistance with cooking/housework;Help with stairs or ramp for entrance   Can travel by private vehicle        Equipment Recommendations Rolling walker (2 wheels)  Recommendations for Other Services       Functional Status Assessment Patient has had a recent decline in their functional status and demonstrates the ability to make significant improvements in function in a reasonable and predictable amount of time.     Precautions / Restrictions Precautions Precautions: Fall Restrictions Weight Bearing Restrictions Per Provider Order: No      Mobility  Bed Mobility Overal bed mobility: Modified Independent                  Transfers Overall transfer level: Needs assistance Equipment used: None Transfers: Sit to/from Stand Sit to Stand: Supervision           General transfer comment: CGA from EOB, supervision to return    Ambulation/Gait Ambulation/Gait assistance: Supervision Gait Distance (Feet): 380 Feet Assistive device: Rolling walker (2 wheels), None   Gait  velocity: 0.5m/s Lee RW; far slower without     General Gait Details: pt remains nervouse abotu falling again, very slow, no aberrant sway/LOB but does well with railing, RW.  Stairs            Wheelchair Mobility     Tilt Bed    Modified Rankin (Stroke Patients Only)       Balance Overall balance assessment: Modified Independent, History of Falls                                           Pertinent Vitals/Pain Pain Assessment Pain Assessment: No/denies pain    Home Living Family/patient expects to be discharged to:: Private residence Living Arrangements: Alone Available Help at Discharge: Family;Available PRN/intermittently Type of Home: House Home Access: Stairs to enter Entrance Stairs-Rails: Right Entrance Stairs-Number of Steps: 10 steps, or 2 without hand support at front   Home Layout: Laundry or work area in basement;Able to live on main level with bedroom/bathroom Home Equipment: Agricultural consultant (2 wheels);Cane - single point;Shower seat      Prior Function Prior Level of Function : Independent/Modified Independent             Mobility Comments: son comes every thursday for appts, hair appts, take her grocery shopping. ADLs Comments: independent, does her own laundry, cooking     Extremity/Trunk Assessment  Communication        Cognition Arousal: Alert Behavior During Therapy: WFL for tasks assessed/performed   PT - Cognitive impairments: No apparent impairments                                 Cueing       General Comments      Exercises     Assessment/Plan    PT Assessment Patient needs continued PT services  PT Problem List Decreased activity tolerance;Decreased balance;Decreased mobility       PT Treatment Interventions Balance training;Gait training;Neuromuscular re-education;Stair training;Therapeutic activities;Functional mobility training;Patient/family  education;Therapeutic exercise    PT Goals (Current goals can be found in the Care Plan section)  Acute Rehab PT Goals Patient Stated Goal: to go home PT Goal Formulation: With patient Time For Goal Achievement: 10/15/23 Potential to Achieve Goals: Good    Frequency Min 2X/week     Co-evaluation               AM-PAC PT 6 Clicks Mobility  Outcome Measure Help needed turning from your back to your side while in a flat bed without using bedrails?: A Little Help needed moving from lying on your back to sitting on the side of a flat bed without using bedrails?: A Little Help needed moving to and from a bed to a chair (including a wheelchair)?: A Little Help needed standing up from a chair using your arms (e.g., wheelchair or bedside chair)?: A Little Help needed to walk in hospital room?: A Little Help needed climbing 3-5 steps with a railing? : A Little 6 Click Score: 18    End of Session Equipment Utilized During Treatment: Gait belt Activity Tolerance: Patient tolerated treatment well;No increased pain Patient left: in bed;with call bell/phone within reach;with family/visitor present   PT Visit Diagnosis: Other abnormalities of gait and mobility (R26.89);Difficulty in walking, not elsewhere classified (R26.2);Muscle weakness (generalized) (M62.81)    Time: 8879-8866 PT Time Calculation (min) (ACUTE ONLY): 13 min   Charges:   PT Evaluation $PT Eval Moderate Complexity: 1 Mod   PT General Charges $$ ACUTE PT VISIT: 1 Visit       11:54 AM, 10/01/23 Stacey Lee, PT, DPT Physical Therapist - [facility]  [phone] (ASCOM)    Stacey Lee 10/01/2023, 11:52 AM

## 2023-10-08 ENCOUNTER — Ambulatory Visit (INDEPENDENT_AMBULATORY_CARE_PROVIDER_SITE_OTHER): Payer: Self-pay | Admitting: Vascular Surgery

## 2023-10-08 VITALS — BP 124/74 | HR 105 | Ht 60.0 in | Wt 108.0 lb

## 2023-10-08 DIAGNOSIS — I480 Paroxysmal atrial fibrillation: Secondary | ICD-10-CM

## 2023-10-08 DIAGNOSIS — K922 Gastrointestinal hemorrhage, unspecified: Secondary | ICD-10-CM

## 2023-10-08 DIAGNOSIS — I10 Essential (primary) hypertension: Secondary | ICD-10-CM | POA: Diagnosis not present

## 2023-10-08 NOTE — Assessment & Plan Note (Signed)
 She is roughly 1 week status post embolization for lower GI bleeding with resolution of the bleeding after embolization.  She has had a good result.  She will have to resume anticoagulation at some point for her paroxysmal A-fib.  When she goes back to see her cardiologist on the 24th, if he thinks it is necessary to resume the anticoagulation I am okay with that.  We would usually wait 2 to 3 weeks after the embolization for resuming anticoagulation.  Follow-up as needed

## 2023-10-08 NOTE — Progress Notes (Signed)
 MRN : [id number]  Lorielle TIYAH ZELENAK is a 89 y.o. (01-04-1931) female who presents with chief complaint of  Chief Complaint  Patient presents with   Follow-up    Schedule an appointment with Marea Selinda RAMAN, MD ([place]  .  History of Present Illness: Patient returns today in follow up of her recent hospitalization for lower GI bleeding.  We treated this with embolization a little over a week ago.  She has had no further GI bleeding.  Her hemoglobin is 10.  She does need to go back on anticoagulation in the near future due to her cardiac arrhythmias and she is scheduled to see her cardiologist again on 10/20/2023.  Her access site is well-healed.  She has no new complaints today.  Current Outpatient Medications  Medication Sig Dispense Refill   amLODipine  (NORVASC ) 5 MG tablet Take 5 mg by mouth daily.     Calcium  Carb-Cholecalciferol  (CALCIUM  600 + D PO) Take 1 tablet by mouth 2 (two) times daily.     Coenzyme Q10 (CO Q 10) 100 MG CAPS Take 1 tablet by mouth daily.     dorzolamide  (TRUSOPT ) 2 % ophthalmic solution Place 1 drop into both eyes 2 (two) times daily.     Fe Fum-Vit C-Vit B12-FA (TRIGELS-F FORTE) CAPS capsule Take 1 capsule by mouth daily after breakfast. 90 capsule 0   lovastatin (MEVACOR) 20 MG tablet Take 20 mg by mouth 3 (three) times a week. Monday, Wednesday, and Friday     nitroGLYCERIN  (NITROSTAT ) 0.4 MG SL tablet Place 1 tablet (0.4 mg total) under the tongue every 5 (five) minutes as needed (hypertension). Please only use for symptoms of lightheadedness AND blood pressure above 150 on top AND above 100 on the bottom 100 tablet 3   Omega-3 Fatty Acids (FISH OIL) 1000 MG CAPS Take 1 capsule by mouth 2 (two) times daily.     prednisoLONE  acetate (PRED FORTE ) 1 % ophthalmic suspension Place 1 drop into the left eye daily.     Red Yeast Rice 600 MG CAPS Take 1 capsule by mouth 2 (two) times daily.     hydrocortisone 2.5 % cream Apply topically 3 (three) times daily. (Patient not taking:  Reported on 10/08/2023)     Multiple Vitamin (MULTIVITAMIN WITH MINERALS) TABS tablet Take 1 tablet by mouth daily. (Patient not taking: Reported on 10/08/2023)     No current facility-administered medications for this visit.    Past Medical History:  Diagnosis Date   Glaucoma    Hyperlipidemia    Hypertension     Past Surgical History:  Procedure Laterality Date   ABDOMINAL HYSTERECTOMY     BREAST BIOPSY Right 1954   negative   COLONOSCOPY N/A 06/14/2014   Procedure: COLONOSCOPY;  Surgeon: Deward CINDERELLA Piedmont, MD;  Location: [facility] ENDOSCOPY;  Service: Gastroenterology;  Laterality: N/A;   EYE SURGERY     VISCERAL ANGIOGRAPHY N/A 09/30/2023   Procedure: VISCERAL ANGIOGRAPHY;  Surgeon: Marea Selinda RAMAN, MD;  Location: [facility] INVASIVE CV LAB;  Service: Cardiovascular;  Laterality: N/A;     Social History   Tobacco Use   Smoking status: Never   Smokeless tobacco: Never  Vaping Use   Vaping status: Never Used  Substance Use Topics   Alcohol use: Not Currently   Drug use: Never      Family History  Problem Relation Age of Onset   Prostate cancer Father    Tuberculosis Maternal Grandfather    Breast cancer Neg Hx  Kidney cancer Neg Hx    Bladder Cancer Neg Hx      Allergies  Allergen Reactions   Acetanilide Shortness Of Breath and Nausea Only   Acetazolamide Nausea Only and Other (See Comments)    Other Reaction: SOB, dizzy (po) Other Reaction: SOB, dizzy (po)    Penicillins Swelling     REVIEW OF SYSTEMS (Negative unless checked)  Constitutional: [] Weight loss  [] Fever  [] Chills Cardiac: [] Chest pain   [] Chest pressure   [x] Palpitations   [] Shortness of breath when laying flat   [] Shortness of breath at rest   [] Shortness of breath with exertion. Vascular:  [] Pain in legs with walking   [] Pain in legs at rest   [] Pain in legs when laying flat   [] Claudication   [] Pain in feet when walking  [] Pain in feet at rest  [] Pain in feet when laying flat   [] History of DVT   [] Phlebitis    [] Swelling in legs   [] Varicose veins   [] Non-healing ulcers Pulmonary:   [] Uses home oxygen   [] Productive cough   [] Hemoptysis   [] Wheeze  [] COPD   [] Asthma Neurologic:  [] Dizziness  [] Blackouts   [] Seizures   [] History of stroke   [] History of TIA  [] Aphasia   [] Temporary blindness   [] Dysphagia   [] Weakness or numbness in arms   [] Weakness or numbness in legs Musculoskeletal:  [x] Arthritis   [] Joint swelling   [] Joint pain   [] Low back pain Hematologic:  [x] Easy bruising  [] Easy bleeding   [] Hypercoagulable state   [] Anemic   Gastrointestinal:  [x] Blood in stool   [] Vomiting blood  [] Gastroesophageal reflux/heartburn   [] Abdominal pain Genitourinary:  [] Chronic kidney disease   [] Difficult urination  [] Frequent urination  [] Burning with urination   [] Hematuria Skin:  [] Rashes   [] Ulcers   [] Wounds Psychological:  [] History of anxiety   []  History of major depression.  Physical Examination  BP 124/74   Pulse (!) 105   Ht 5' (1.524 m)   Wt 108 lb (49 kg)   BMI 21.09 kg/m  Gen:  WD/WN, NAD.  Appears younger than stated age Head: [place]/AT, No temporalis wasting. Ear/Nose/Throat: Hearing grossly intact, nares w/o erythema or drainage Eyes: Conjunctiva clear. Sclera non-icteric Neck: Supple.  Trachea midline Pulmonary:  Good air movement, no use of accessory muscles.  Cardiac: Tachycardic and irregular Vascular:  Vessel Right Left  Radial Palpable Palpable                                   Gastrointestinal: soft, non-tender/non-distended. No guarding/reflex.  Musculoskeletal: M/S 5/5 throughout.  No deformity or atrophy.  No edema. Neurologic: Sensation grossly intact in extremities.  Symmetrical.  Speech is fluent.  Psychiatric: Judgment intact, Mood & affect appropriate for pt's clinical situation. Dermatologic: No rashes or ulcers noted.  No cellulitis or open wounds.  Access site is well-healed.      Labs Recent Results (from the past 2160 hours)  CBC with  Differential     Status: Abnormal   Collection Time: 09/26/23  3:34 PM  Result Value Ref Range   WBC 11.3 (H) 4.0 - 10.5 K/uL   RBC 3.74 (L) 3.87 - 5.11 MIL/uL   Hemoglobin 11.2 (L) 12.0 - 15.0 g/dL   HCT 64.5 (L) 63.9 - 53.9 %   MCV 94.7 80.0 - 100.0 fL   MCH 29.9 26.0 - 34.0 pg   MCHC 31.6 30.0 - 36.0  g/dL   RDW 86.0 88.4 - 84.4 %   Platelets 210 150 - 400 K/uL   nRBC 0.0 0.0 - 0.2 %   Neutrophils Relative % 61 %   Neutro Abs 6.8 1.7 - 7.7 K/uL   Lymphocytes Relative 27 %   Lymphs Abs 3.1 0.7 - 4.0 K/uL   Monocytes Relative 9 %   Monocytes Absolute 1.0 0.1 - 1.0 K/uL   Eosinophils Relative 2 %   Eosinophils Absolute 0.2 0.0 - 0.5 K/uL   Basophils Relative 0 %   Basophils Absolute 0.1 0.0 - 0.1 K/uL   Immature Granulocytes 1 %   Abs Immature Granulocytes 0.06 0.00 - 0.07 K/uL    Comment: Performed at [facility], [address] [place], [place] [address]  Comprehensive metabolic panel     Status: Abnormal   Collection Time: 09/26/23  3:34 PM  Result Value Ref Range   Sodium 137 135 - 145 mmol/L   Potassium 4.6 3.5 - 5.1 mmol/L    Comment: HEMOLYSIS AT THIS LEVEL MAY AFFECT RESULT   Chloride 106 98 - 111 mmol/L   CO2 23 22 - 32 mmol/L   Glucose, Bld 154 (H) 70 - 99 mg/dL    Comment: Glucose reference range applies only to samples taken after fasting for at least 8 hours.   BUN 42 (H) 8 - 23 mg/dL   Creatinine, Ser 9.33 0.44 - 1.00 mg/dL   Calcium  8.6 (L) 8.9 - 10.3 mg/dL   Total Protein 7.1 6.5 - 8.1 g/dL   Albumin 3.5 3.5 - 5.0 g/dL   AST 31 15 - 41 U/L    Comment: HEMOLYSIS AT THIS LEVEL MAY AFFECT RESULT   ALT 11 0 - 44 U/L    Comment: HEMOLYSIS AT THIS LEVEL MAY AFFECT RESULT   Alkaline Phosphatase 56 38 - 126 U/L   Total Bilirubin 0.7 0.0 - 1.2 mg/dL    Comment: HEMOLYSIS AT THIS LEVEL MAY AFFECT RESULT   GFR, Estimated >60 >60 mL/min    Comment: (NOTE) Calculated using the CKD-EPI Creatinine Equation (2021)    Anion gap 8 5 - 15    Comment:  Performed at [facility], [address] [place], [place] [address]  Troponin I (High Sensitivity)     Status: None   Collection Time: 09/26/23  3:34 PM  Result Value Ref Range   Troponin I (High Sensitivity) 11 <18 ng/L    Comment: (NOTE) Elevated high sensitivity troponin I (hsTnI) values and significant  changes across serial measurements may suggest ACS but many other  chronic and acute conditions are known to elevate hsTnI results.  Refer to the Links section for chest pain algorithms and additional  guidance. Performed at [facility], [address] [place], [place] [address]   Troponin I (High Sensitivity)     Status: None   Collection Time: 09/26/23  5:22 PM  Result Value Ref Range   Troponin I (High Sensitivity) 13 <18 ng/L    Comment: (NOTE) Elevated high sensitivity troponin I (hsTnI) values and significant  changes across serial measurements may suggest ACS but many other  chronic and acute conditions are known to elevate hsTnI results.  Refer to the Links section for chest pain algorithms and additional  guidance. Performed at [facility], [address] [place], [place] [address]   Urinalysis, Routine w reflex microscopic -Urine, Clean Catch     Status: Abnormal   Collection Time: 09/26/23 10:45 PM  Result  Value Ref Range   Color, Urine STRAW (A) YELLOW   APPearance CLEAR (A) CLEAR   Specific Gravity, Urine 1.012 1.005 - 1.030   pH 6.0 5.0 - 8.0   Glucose, UA NEGATIVE NEGATIVE mg/dL   Hgb urine dipstick NEGATIVE NEGATIVE   Bilirubin Urine NEGATIVE NEGATIVE   Ketones, ur NEGATIVE NEGATIVE mg/dL   Protein, ur NEGATIVE NEGATIVE mg/dL   Nitrite NEGATIVE NEGATIVE   Leukocytes,Ua NEGATIVE NEGATIVE    Comment: Performed at [facility], [address] [place], [place] [address]  Comprehensive metabolic panel     Status: Abnormal   Collection Time: 09/27/23  4:50 AM  Result Value Ref Range   Sodium 140 135 - 145  mmol/L   Potassium 3.9 3.5 - 5.1 mmol/L   Chloride 109 98 - 111 mmol/L   CO2 26 22 - 32 mmol/L   Glucose, Bld 101 (H) 70 - 99 mg/dL    Comment: Glucose reference range applies only to samples taken after fasting for at least 8 hours.   BUN 33 (H) 8 - 23 mg/dL   Creatinine, Ser 9.28 0.44 - 1.00 mg/dL   Calcium  8.3 (L) 8.9 - 10.3 mg/dL   Total Protein 5.6 (L) 6.5 - 8.1 g/dL   Albumin 2.8 (L) 3.5 - 5.0 g/dL   AST 21 15 - 41 U/L   ALT 14 0 - 44 U/L   Alkaline Phosphatase 45 38 - 126 U/L   Total Bilirubin 0.7 0.0 - 1.2 mg/dL   GFR, Estimated >39 >39 mL/min    Comment: (NOTE) Calculated using the CKD-EPI Creatinine Equation (2021)    Anion gap 5 5 - 15    Comment: Performed at [facility], [address] [place], [place] [address]  CBC     Status: Abnormal   Collection Time: 09/27/23  4:50 AM  Result Value Ref Range   WBC 7.5 4.0 - 10.5 K/uL   RBC 2.96 (L) 3.87 - 5.11 MIL/uL   Hemoglobin 9.0 (L) 12.0 - 15.0 g/dL   HCT 72.4 (L) 63.9 - 53.9 %   MCV 92.9 80.0 - 100.0 fL   MCH 30.4 26.0 - 34.0 pg   MCHC 32.7 30.0 - 36.0 g/dL   RDW 86.0 88.4 - 84.4 %   Platelets 169 150 - 400 K/uL   nRBC 0.0 0.0 - 0.2 %    Comment: Performed at [facility], [address], [place], [place] [address]  Magnesium     Status: None   Collection Time: 09/27/23  4:50 AM  Result Value Ref Range   Magnesium 1.8 1.7 - 2.4 mg/dL    Comment: Performed at [facility], [address] [place], [place] [address]  Phosphorus     Status: None   Collection Time: 09/27/23  4:50 AM  Result Value Ref Range   Phosphorus 2.8 2.5 - 4.6 mg/dL    Comment: Performed at [facility], [address] [place], [place] [address]  ECHOCARDIOGRAM COMPLETE     Status: None   Collection Time: 09/27/23 12:00 PM  Result Value Ref Range   Weight 1,728 oz   Height 60 in   BP 104/57 mmHg   Ao pk vel 1.42 m/s   AR max vel 2.25 cm2   AV Peak grad 8.0 mmHg   S' Lateral 2.10 cm   Area-P  1/2 3.53 cm2   P 1/2 time 533 msec   MV VTI 2.13 cm2   Est EF 60 -  65%   CBC with Differential     Status: Abnormal   Collection Time: 09/29/23  1:48 PM  Result Value Ref Range   WBC 9.9 4.0 - 10.5 K/uL   RBC 2.82 (L) 3.87 - 5.11 MIL/uL   Hemoglobin 8.5 (L) 12.0 - 15.0 g/dL   HCT 72.9 (L) 63.9 - 53.9 %   MCV 95.7 80.0 - 100.0 fL   MCH 30.1 26.0 - 34.0 pg   MCHC 31.5 30.0 - 36.0 g/dL   RDW 85.4 88.4 - 84.4 %   Platelets 222 150 - 400 K/uL   nRBC 0.0 0.0 - 0.2 %   Neutrophils Relative % 61 %   Neutro Abs 6.1 1.7 - 7.7 K/uL   Lymphocytes Relative 29 %   Lymphs Abs 2.8 0.7 - 4.0 K/uL   Monocytes Relative 8 %   Monocytes Absolute 0.8 0.1 - 1.0 K/uL   Eosinophils Relative 1 %   Eosinophils Absolute 0.1 0.0 - 0.5 K/uL   Basophils Relative 0 %   Basophils Absolute 0.0 0.0 - 0.1 K/uL   Immature Granulocytes 1 %   Abs Immature Granulocytes 0.05 0.00 - 0.07 K/uL    Comment: Performed at [facility], [address] [place], [place] [address]  Comprehensive metabolic panel     Status: Abnormal   Collection Time: 09/29/23  1:48 PM  Result Value Ref Range   Sodium 137 135 - 145 mmol/L   Potassium 4.2 3.5 - 5.1 mmol/L   Chloride 108 98 - 111 mmol/L   CO2 21 (L) 22 - 32 mmol/L   Glucose, Bld 150 (H) 70 - 99 mg/dL    Comment: Glucose reference range applies only to samples taken after fasting for at least 8 hours.   BUN 24 (H) 8 - 23 mg/dL   Creatinine, Ser 9.27 0.44 - 1.00 mg/dL   Calcium  8.7 (L) 8.9 - 10.3 mg/dL   Total Protein 6.8 6.5 - 8.1 g/dL   Albumin 3.4 (L) 3.5 - 5.0 g/dL   AST 31 15 - 41 U/L   ALT 14 0 - 44 U/L   Alkaline Phosphatase 52 38 - 126 U/L   Total Bilirubin 0.6 0.0 - 1.2 mg/dL   GFR, Estimated >39 >39 mL/min    Comment: (NOTE) Calculated using the CKD-EPI Creatinine Equation (2021)    Anion gap 8 5 - 15    Comment: Performed at [facility], [address] [place], [place] [address]  Protime-INR     Status: None   Collection Time:  09/29/23  1:48 PM  Result Value Ref Range   Prothrombin Time 14.0 11.4 - 15.2 seconds   INR 1.0 0.8 - 1.2    Comment: (NOTE) INR goal varies based on device and disease states. Performed at [facility], [address] [place], [place] [address]   APTT     Status: None   Collection Time: 09/29/23  1:48 PM  Result Value Ref Range   aPTT 28 24 - 36 seconds    Comment: Performed at [facility], [address] [place], [place] [address]  Type and screen [facility]     Status: None   Collection Time: 09/29/23  9:45 PM  Result Value Ref Range   ABO/RH(D) O POS    Antibody Screen NEG    Sample Expiration 10/02/2023,2359    Unit Number [id number]    Blood Component Type RED CELLS,LR    Unit division 00  Status of Unit ISSUED,FINAL    Transfusion Status OK TO TRANSFUSE    Crossmatch Result      Compatible Performed at [facility], [address] [place], [place] [address]    Unit Number [id number]    Blood Component Type RBC LR PHER2    Unit division 00    Status of Unit ISSUED,FINAL    Transfusion Status OK TO TRANSFUSE    Crossmatch Result Compatible   BPAM RBC     Status: None   Collection Time: 09/29/23  9:45 PM  Result Value Ref Range   ISSUE DATE / TIME 797490959140    Blood Product Unit Number [id number]    PRODUCT CODE [id number]    Unit Type and Rh 5100    Blood Product Expiration Date 797489957640    ISSUE DATE / TIME 797490958974    Blood Product Unit Number [id number]    PRODUCT CODE [id number]    Unit Type and Rh 5100    Blood Product Expiration Date 797489947640   CBC     Status: Abnormal   Collection Time: 09/29/23 11:03 PM  Result Value Ref Range   WBC 7.8 4.0 - 10.5 K/uL   RBC 2.28 (L) 3.87 - 5.11 MIL/uL   Hemoglobin 7.0 (L) 12.0 - 15.0 g/dL   HCT 78.0 (L) 63.9 - 53.9 %   MCV 96.1 80.0 - 100.0 fL   MCH 30.7 26.0 - 34.0 pg   MCHC 32.0 30.0 - 36.0 g/dL   RDW 85.4 88.4 - 84.4 %    Platelets 188 150 - 400 K/uL   nRBC 0.0 0.0 - 0.2 %    Comment: Performed at [facility], [address] [place], [place] [address]  CBC     Status: Abnormal   Collection Time: 09/30/23  5:10 AM  Result Value Ref Range   WBC 7.6 4.0 - 10.5 K/uL   RBC 2.20 (L) 3.87 - 5.11 MIL/uL   Hemoglobin 6.7 (L) 12.0 - 15.0 g/dL   HCT 77.9 (L) 63.9 - 53.9 %   MCV 100.0 80.0 - 100.0 fL   MCH 30.5 26.0 - 34.0 pg   MCHC 30.5 30.0 - 36.0 g/dL   RDW 85.1 88.4 - 84.4 %   Platelets 184 150 - 400 K/uL   nRBC 0.0 0.0 - 0.2 %    Comment: Performed at [facility], [address] [place], [place] [address]  Comprehensive metabolic panel     Status: Abnormal   Collection Time: 09/30/23  5:10 AM  Result Value Ref Range   Sodium 138 135 - 145 mmol/L   Potassium 3.7 3.5 - 5.1 mmol/L   Chloride 107 98 - 111 mmol/L   CO2 24 22 - 32 mmol/L   Glucose, Bld 148 (H) 70 - 99 mg/dL    Comment: Glucose reference range applies only to samples taken after fasting for at least 8 hours.   BUN 20 8 - 23 mg/dL   Creatinine, Ser 9.30 0.44 - 1.00 mg/dL   Calcium  8.0 (L) 8.9 - 10.3 mg/dL   Total Protein 5.5 (L) 6.5 - 8.1 g/dL   Albumin 2.7 (L) 3.5 - 5.0 g/dL   AST 24 15 - 41 U/L   ALT 14 0 - 44 U/L   Alkaline Phosphatase 41 38 - 126 U/L   Total Bilirubin 0.6 0.0 - 1.2 mg/dL   GFR, Estimated >39 >39 mL/min    Comment: (NOTE) Calculated using the CKD-EPI Creatinine Equation (2021)  Anion gap 7 5 - 15    Comment: Performed at [facility], [address] [place], [place] [address]  ABO/Rh     Status: None   Collection Time: 09/30/23  5:10 AM  Result Value Ref Range   ABO/RH(D)      O POS Performed at [facility], [address] [place], [place] [address]   Prepare RBC (crossmatch)     Status: None   Collection Time: 09/30/23  8:08 AM  Result Value Ref Range   Order Confirmation      ORDER PROCESSED BY BLOOD BANK Performed at [facility], [address] [place], [place] [address]   CBC     Status: Abnormal   Collection Time: 09/30/23  2:13 PM  Result Value Ref Range   WBC 10.2 4.0 - 10.5 K/uL   RBC 3.27 (L) 3.87 - 5.11 MIL/uL   Hemoglobin 9.9 (L) 12.0 - 15.0 g/dL    Comment: REPEATED TO VERIFY   HCT 29.0 (L) 36.0 - 46.0 %   MCV 88.7 80.0 - 100.0 fL    Comment: REPEATED TO VERIFY   MCH 30.3 26.0 - 34.0 pg   MCHC 34.1 30.0 - 36.0 g/dL   RDW 84.1 (H) 88.4 - 84.4 %   Platelets 178 150 - 400 K/uL   nRBC 0.0 0.0 - 0.2 %    Comment: Performed at [facility], [address] [place], [place] [address]  CBC     Status: Abnormal   Collection Time: 10/01/23  2:14 AM  Result Value Ref Range   WBC 13.7 (H) 4.0 - 10.5 K/uL   RBC 3.30 (L) 3.87 - 5.11 MIL/uL   Hemoglobin 9.9 (L) 12.0 - 15.0 g/dL   HCT 70.5 (L) 63.9 - 53.9 %   MCV 89.1 80.0 - 100.0 fL   MCH 30.0 26.0 - 34.0 pg   MCHC 33.7 30.0 - 36.0 g/dL   RDW 84.0 (H) 88.4 - 84.4 %   Platelets 175 150 - 400 K/uL   nRBC 0.0 0.0 - 0.2 %    Comment: Performed at [facility], [address] [place], [place] [address]    Radiology PERIPHERAL VASCULAR CATHETERIZATION Result Date: 09/30/2023 See surgical note for result.  CT ANGIO GI BLEED Result Date: 09/29/2023 CLINICAL DATA:  Painless lower GI bleed. History of diverticular bleed. EXAM: CTA ABDOMEN AND PELVIS WITHOUT AND WITH CONTRAST TECHNIQUE: Multidetector CT imaging of the abdomen and pelvis was performed using the standard protocol during bolus administration of intravenous contrast. Multiplanar reconstructed images and MIPs were obtained and reviewed to evaluate the vascular anatomy. RADIATION DOSE REDUCTION: This exam was performed according to the departmental dose-optimization program which includes automated exposure control, adjustment of the mA and/or kV according to patient size and/or use of iterative reconstruction technique. CONTRAST:  OMNIPAQUE  IOHEXOL  350 MG/ML SOLN COMPARISON:  CT abdomen pelvis dated  09/17/2017. FINDINGS: Evaluation is limited due to streak artifact caused by patient's arms. VASCULAR Aorta: Mild atherosclerotic calcification. No aneurysmal dilatation or dissection. No periaortic fluid collection. Celiac: There is atherosclerotic calcification of the origin of the celiac trunk with luminal narrowing. The celiac artery and its major branches remain patent. SMA: The SMA is patent. Renals: The renal arteries are patent. Small accessory right renal arteries noted. IMA: The IMA is patent. Inflow: Mild atherosclerotic calcification. No aneurysmal dilatation or dissection. The iliac arteries are patent. Proximal Outflow: The visualized proximal on fluids patent. Veins: The IVC is  unremarkable. The SMV, splenic vein, and main portal vein are patent. No portal venous gas. Review of the MIP images confirms the above findings. NON-VASCULAR Lower chest: An area of atelectasis/scarring in the right middle lobe. Calcification of the mitral annulus. No intra-abdominal free air or free fluid. Hepatobiliary: The liver is unremarkable. Small gallstone. No pericholecystic fluid or evidence of acute cholecystitis by CT. Pancreas: Unremarkable. No pancreatic ductal dilatation or surrounding inflammatory changes. Spleen: Normal in size without focal abnormality. Adrenals/Urinary Tract: The adrenal glands are unremarkable. There is no hydronephrosis or nephrolithiasis on either side. The visualized ureters and urinary bladder appear unremarkable. Stomach/Bowel: There is severe sigmoid diverticulosis. Contrast pooling noted in the distal ascending colon in the right upper abdomen and close to the hepatic flexure (coronal series 14 images 66/75 and series 21, image 62). Underlying mass/malignancy as the cause of bleed is not excluded. There is no bowel obstruction or active inflammation. The appendix is normal. Lymphatic: No adenopathy. Reproductive: Hysterectomy.  No suspicious adnexal masses. Other: None  Musculoskeletal: Osteopenia with degenerative changes of the spine. No acute osseous pathology. IMPRESSION: 1. Positive for active GI bleed in the distal ascending colon close to the hepatic flexure. 2. Severe sigmoid diverticulosis. No bowel obstruction. Normal appendix. 3. Cholelithiasis. 4.  Aortic Atherosclerosis (ICD10-I70.0). Electronically Signed   By: Vanetta Chou M.D.   On: 09/29/2023 20:55   US  Carotid Bilateral Result Date: 09/27/2023 CLINICAL DATA:  Syncope EXAM: BILATERAL CAROTID DUPLEX ULTRASOUND TECHNIQUE: Elnor scale imaging, color Doppler and duplex ultrasound were performed of bilateral carotid and vertebral arteries in the neck. COMPARISON:  None Available. FINDINGS: Criteria: Quantification of carotid stenosis is based on velocity parameters that correlate the residual internal carotid diameter with NASCET-based stenosis levels, using the diameter of the distal internal carotid lumen as the denominator for stenosis measurement. The following velocity measurements were obtained: RIGHT ICA: 95 cm/sec CCA: 66 cm/sec SYSTOLIC ICA/CCA RATIO:  1.4 ECA: 62 cm/sec LEFT ICA: 101 cm/sec CCA: 66 cm/sec SYSTOLIC ICA/CCA RATIO:  1.5 ECA: 77 cm/sec RIGHT CAROTID ARTERY: Patent. RIGHT VERTEBRAL ARTERY:  Antegrade. LEFT CAROTID ARTERY:  Patent. LEFT VERTEBRAL ARTERY:  Antegrade. IMPRESSION: 1. Right carotid system: Within normal limits. No evidence of flow-limiting stenosis. 2. Left carotid system: Within normal limits. No evidence of flow-limiting stenosis. Electronically Signed   By: Maude Naegeli M.D.   On: 09/27/2023 12:31   ECHOCARDIOGRAM COMPLETE Result Date: 09/27/2023    ECHOCARDIOGRAM REPORT   Patient Name:   FREDRICK DRAY Date of Exam: 09/27/2023 Medical Rec #:  [id number]    Height:       60.0 in Accession #:    [id number]   Weight:       108.0 lb Date of Birth:  12/25/1930    BSA:          1.437 m Patient Age:    89 years     BP:           104/57 mmHg Patient Gender: F            HR:           90  bpm. Exam Location:  [facility] Procedure: 2D Echo, Cardiac Doppler and Color Doppler (Both Spectral and Color            Flow Doppler were utilized during procedure). Indications:     Syncope R55  History:         Patient has no prior history of Echocardiogram examinations.  Sonographer:  Thedora Louder RDCS, FASE Referring Phys:  [id number] [facility] ADEFESO Diagnosing Phys: Denyse Bathe IMPRESSIONS  1. Left ventricular ejection fraction, by estimation, is 60 to 65%. The left ventricle has normal function. The left ventricle has no regional wall motion abnormalities. There is moderate concentric left ventricular hypertrophy. Left ventricular diastolic parameters are consistent with Grade II diastolic dysfunction (pseudonormalization).  2. Right ventricular systolic function is normal. The right ventricular size is normal.  3. Left atrial size was severely dilated.  4. Right atrial size was mildly dilated.  5. The mitral valve is abnormal. Mild to moderate mitral valve regurgitation. No evidence of mitral stenosis. Severe mitral annular calcification.  6. The aortic valve is normal in structure. Aortic valve regurgitation is mild to moderate. Aortic valve sclerosis/calcification is present, without any evidence of aortic stenosis.  7. The inferior vena cava is normal in size with greater than 50% respiratory variability, suggesting right atrial pressure of 3 mmHg. FINDINGS  Left Ventricle: Left ventricular ejection fraction, by estimation, is 60 to 65%. The left ventricle has normal function. The left ventricle has no regional wall motion abnormalities. Strain was performed and the global longitudinal strain is indeterminate. The left ventricular internal cavity size was normal in size. There is moderate concentric left ventricular hypertrophy. Left ventricular diastolic parameters are consistent with Grade II diastolic dysfunction (pseudonormalization). Right Ventricle: The right ventricular size is normal. No increase  in right ventricular wall thickness. Right ventricular systolic function is normal. Left Atrium: Left atrial size was severely dilated. Right Atrium: Right atrial size was mildly dilated. Pericardium: There is no evidence of pericardial effusion. Mitral Valve: The mitral valve is abnormal. Severe mitral annular calcification. Mild to moderate mitral valve regurgitation. No evidence of mitral valve stenosis. MV peak gradient, 6.0 mmHg. The mean mitral valve gradient is 2.0 mmHg. Tricuspid Valve: The tricuspid valve is normal in structure. Tricuspid valve regurgitation is mild . No evidence of tricuspid stenosis. Aortic Valve: The aortic valve is normal in structure. Aortic valve regurgitation is mild to moderate. Aortic regurgitation PHT measures 533 msec. Aortic valve sclerosis/calcification is present, without any evidence of aortic stenosis. Aortic valve peak  gradient measures 8.0 mmHg. Pulmonic Valve: The pulmonic valve was normal in structure. Pulmonic valve regurgitation is not visualized. Aorta: The aortic root is normal in size and structure. Venous: The inferior vena cava is normal in size with greater than 50% respiratory variability, suggesting right atrial pressure of 3 mmHg. IAS/Shunts: No atrial level shunt detected by color flow Doppler. Additional Comments: 3D was performed not requiring image post processing on an independent workstation and was indeterminate.  LEFT VENTRICLE PLAX 2D LVIDd:         3.40 cm   Diastology LVIDs:         2.10 cm   LV e' medial:    7.07 cm/s LV PW:         1.30 cm   LV E/e' medial:  18.5 LV IVS:        1.50 cm   LV e' lateral:   10.10 cm/s LVOT diam:     1.80 cm   LV E/e' lateral: 13.0 LV SV:         63 LV SV Index:   44 LVOT Area:     2.54 cm  RIGHT VENTRICLE RV Basal diam:  2.50 cm RV S prime:     11.00 cm/s TAPSE (M-mode): 1.9 cm LEFT ATRIUM  Index        RIGHT ATRIUM           Index LA diam:        3.90 cm 2.71 cm/m   RA Area:     17.40 cm LA Vol (A2C):    71.4 ml 49.70 ml/m  RA Volume:   40.90 ml  28.47 ml/m LA Vol (A4C):   50.6 ml 35.22 ml/m LA Biplane Vol: 60.0 ml 41.77 ml/m  AORTIC VALVE                 PULMONIC VALVE AV Area (Vmax): 2.25 cm     PV Vmax:          0.96 m/s AV Vmax:        141.50 cm/s  PV Peak grad:     3.7 mmHg AV Peak Grad:   8.0 mmHg     PR End Diast Vel: 6.15 msec LVOT Vmax:      125.00 cm/s  RVOT Peak grad:   2 mmHg LVOT Vmean:     80.000 cm/s LVOT VTI:       0.249 m AI PHT:         533 msec  AORTA Ao Root diam: 3.60 cm Ao Asc diam:  3.10 cm MITRAL VALVE MV Area (PHT): 3.53 cm     SHUNTS MV Area VTI:   2.13 cm     Systemic VTI:  0.25 m MV Peak grad:  6.0 mmHg     Systemic Diam: 1.80 cm MV Mean grad:  2.0 mmHg MV Vmax:       1.22 m/s MV Vmean:      68.1 cm/s MV Decel Time: 215 msec MV E velocity: 131.00 cm/s MV A velocity: 42.40 cm/s MV E/A ratio:  3.09 Shaukat Khan Electronically signed by Denyse Bathe Signature Date/Time: 09/27/2023/12:23:56 PM    Final    CT Cervical Spine Wo Contrast Result Date: 09/26/2023 EXAM: CT CERVICAL SPINE WITHOUT CONTRAST 09/26/2023 03:50:16 PM TECHNIQUE: CT of the cervical spine was performed without the administration of intravenous contrast. Multiplanar reformatted images are provided for review. Automated exposure control, iterative reconstruction, and/or weight based adjustment of the mA/kV was utilized to reduce the radiation dose to as low as reasonably achievable. COMPARISON: None available. CLINICAL HISTORY: Neck trauma (Age >= 65y). Head and neck trauma. Mechanical fall. FINDINGS: CERVICAL SPINE: BONES AND ALIGNMENT: No acute fracture or traumatic malalignment. DEGENERATIVE CHANGES: Chronic disc disease and uncovertebral spurring is present at C5-6 and C6-7 bilaterally. Ankylosis is present across the disc space at C5-6 and C6-7. SOFT TISSUES: No prevertebral soft tissue swelling. IMPRESSION: 1. No acute abnormality of the cervical spine related to the reported neck trauma. 2. Chronic disc  disease and uncovertebral spurring at C5-6 and C6-7 bilaterally with ankylosis across the disc space. Electronically signed by: Lonni Necessary MD 09/26/2023 04:18 PM EDT RP Workstation: [id number]   CT HEAD WO CONTRAST ( ) Result Date: 09/26/2023 EXAM: CT HEAD WITHOUT CONTRAST 09/26/2023 03:50:16 PM TECHNIQUE: CT of the head was performed without the administration of intravenous contrast. Automated exposure control, iterative reconstruction, and/or weight based adjustment of the mA/kV was utilized to reduce the radiation dose to as low as reasonably achievable. COMPARISON: None available. CLINICAL HISTORY: Head trauma, intracranial arterial injury suspected. Head and neck trauma. FINDINGS: BRAIN AND VENTRICLES: No acute hemorrhage. No evidence of acute infarct. No hydrocephalus. No extra-axial collection. No mass effect or midline shift. Mild atrophy and white matter changes are stable. ORBITS:  No acute abnormality. SINUSES: No acute abnormality. SOFT TISSUES AND SKULL: A left paramedian anterior frontal scalp hematoma is present. No underlying fracture or foreign body is present. IMPRESSION: 1. No acute intracranial abnormality. 2. Left paramedian anterior frontal scalp hematoma without underlying fracture or foreign body. Electronically signed by: Lonni Necessary MD 09/26/2023 04:16 PM EDT RP Workstation: [id number]    Assessment/Plan  Lower GI bleed She is roughly 1 week status post embolization for lower GI bleeding with resolution of the bleeding after embolization.  She has had a good result.  She will have to resume anticoagulation at some point for her paroxysmal A-fib.  When she goes back to see her cardiologist on the 24th, if he thinks it is necessary to resume the anticoagulation I am okay with that.  We would usually wait 2 to 3 weeks after the embolization for resuming anticoagulation.  Follow-up as needed  Essential hypertension blood pressure control important in reducing the  progression of atherosclerotic disease. On appropriate oral medications.   Paroxysmal atrial fibrillation (HCC) Will need to go back on anticoagulation.  Follows with cardiology.    Selinda Gu, MD  10/08/2023 12:32 PM    This note was created with Dragon medical transcription system.  Any errors from dictation are purely unintentional

## 2023-10-08 NOTE — Assessment & Plan Note (Signed)
 blood pressure control important in reducing the progression of atherosclerotic disease. On appropriate oral medications.

## 2023-10-08 NOTE — Assessment & Plan Note (Signed)
 Will need to go back on anticoagulation.  Follows with cardiology.
